# Patient Record
Sex: Female | Born: 1965 | Race: Black or African American | Hispanic: No | Marital: Single | Smoking: Never smoker
Health system: Southern US, Community
[De-identification: ages and names within clinical notes are randomized; demographics above are authoritative.]

## PROBLEM LIST (undated history)

## (undated) DIAGNOSIS — F79 Unspecified intellectual disabilities: Secondary | ICD-10-CM

## (undated) DIAGNOSIS — I2510 Atherosclerotic heart disease of native coronary artery without angina pectoris: Secondary | ICD-10-CM

## (undated) DIAGNOSIS — E079 Disorder of thyroid, unspecified: Secondary | ICD-10-CM

## (undated) DIAGNOSIS — D649 Anemia, unspecified: Secondary | ICD-10-CM

## (undated) DIAGNOSIS — K219 Gastro-esophageal reflux disease without esophagitis: Secondary | ICD-10-CM

## (undated) DIAGNOSIS — I10 Essential (primary) hypertension: Secondary | ICD-10-CM

## (undated) HISTORY — PX: MULTIPLE TOOTH EXTRACTIONS: SHX2053

---

## 2001-11-01 ENCOUNTER — Emergency Department (HOSPITAL_COMMUNITY): Admission: EM | Admit: 2001-11-01 | Discharge: 2001-11-01 | Payer: Self-pay | Admitting: Emergency Medicine

## 2005-07-15 LAB — CONVERTED CEMR LAB: Pap Smear: NORMAL

## 2007-02-03 ENCOUNTER — Ambulatory Visit: Payer: Self-pay | Admitting: Family Medicine

## 2007-02-03 DIAGNOSIS — F79 Unspecified intellectual disabilities: Secondary | ICD-10-CM

## 2007-02-03 DIAGNOSIS — E669 Obesity, unspecified: Secondary | ICD-10-CM

## 2007-02-03 DIAGNOSIS — J309 Allergic rhinitis, unspecified: Secondary | ICD-10-CM | POA: Insufficient documentation

## 2007-02-03 DIAGNOSIS — H519 Unspecified disorder of binocular movement: Secondary | ICD-10-CM | POA: Insufficient documentation

## 2007-02-03 DIAGNOSIS — K219 Gastro-esophageal reflux disease without esophagitis: Secondary | ICD-10-CM

## 2007-02-17 ENCOUNTER — Encounter (INDEPENDENT_AMBULATORY_CARE_PROVIDER_SITE_OTHER): Payer: Self-pay | Admitting: Family Medicine

## 2007-02-18 ENCOUNTER — Telehealth (INDEPENDENT_AMBULATORY_CARE_PROVIDER_SITE_OTHER): Payer: Self-pay | Admitting: *Deleted

## 2007-02-18 LAB — CONVERTED CEMR LAB
ALT: 12 units/L (ref 0–35)
Albumin: 4.3 g/dL (ref 3.5–5.2)
Alkaline Phosphatase: 50 units/L (ref 39–117)
Basophils Relative: 0 % (ref 0–1)
CO2: 19 meq/L (ref 19–32)
Chloride: 104 meq/L (ref 96–112)
Cholesterol: 201 mg/dL — ABNORMAL HIGH (ref 0–200)
Creatinine, Ser: 0.81 mg/dL (ref 0.40–1.20)
Eosinophils Absolute: 0.1 10*3/uL (ref 0.0–0.7)
Glucose, Bld: 88 mg/dL (ref 70–99)
HCT: 35.8 % — ABNORMAL LOW (ref 36.0–46.0)
Hemoglobin: 11.6 g/dL — ABNORMAL LOW (ref 12.0–15.0)
LDL Cholesterol: 133 mg/dL — ABNORMAL HIGH (ref 0–99)
Lymphocytes Relative: 27 % (ref 12–46)
MCHC: 32.4 g/dL (ref 30.0–36.0)
MCV: 94.2 fL (ref 78.0–100.0)
Monocytes Absolute: 0.4 10*3/uL (ref 0.2–0.7)
Neutro Abs: 2.9 10*3/uL (ref 1.7–7.7)
Neutrophils Relative %: 62 % (ref 43–77)
Platelets: 263 10*3/uL (ref 150–400)
Potassium: 4.2 meq/L (ref 3.5–5.3)
RBC: 3.8 M/uL — ABNORMAL LOW (ref 3.87–5.11)
Sodium: 136 meq/L (ref 135–145)
TSH: 4.092 microintl units/mL (ref 0.350–5.50)
Total Bilirubin: 0.4 mg/dL (ref 0.3–1.2)
Total CHOL/HDL Ratio: 4.4
Total Protein: 7.7 g/dL (ref 6.0–8.3)
Triglycerides: 110 mg/dL (ref <150)

## 2007-03-09 ENCOUNTER — Ambulatory Visit: Payer: Self-pay | Admitting: Family Medicine

## 2007-03-09 DIAGNOSIS — E785 Hyperlipidemia, unspecified: Secondary | ICD-10-CM | POA: Insufficient documentation

## 2007-03-09 DIAGNOSIS — D649 Anemia, unspecified: Secondary | ICD-10-CM

## 2007-03-09 LAB — CONVERTED CEMR LAB
Cholesterol, target level: 200 mg/dL
LDL Goal: 160 mg/dL

## 2007-03-11 ENCOUNTER — Encounter (INDEPENDENT_AMBULATORY_CARE_PROVIDER_SITE_OTHER): Payer: Self-pay | Admitting: Family Medicine

## 2007-04-20 ENCOUNTER — Ambulatory Visit: Payer: Self-pay | Admitting: Family Medicine

## 2007-07-20 ENCOUNTER — Ambulatory Visit: Payer: Self-pay | Admitting: Family Medicine

## 2007-08-31 ENCOUNTER — Ambulatory Visit: Payer: Self-pay | Admitting: Family Medicine

## 2007-09-01 ENCOUNTER — Encounter (INDEPENDENT_AMBULATORY_CARE_PROVIDER_SITE_OTHER): Payer: Self-pay | Admitting: Family Medicine

## 2007-09-01 LAB — CONVERTED CEMR LAB
Eosinophils Relative: 2 % (ref 0–5)
HCT: 34.5 % — ABNORMAL LOW (ref 36.0–46.0)
Lymphs Abs: 1.1 10*3/uL (ref 0.7–4.0)
MCHC: 31.6 g/dL (ref 30.0–36.0)
Monocytes Relative: 8 % (ref 3–12)
Neutro Abs: 2.9 10*3/uL (ref 1.7–7.7)
Neutrophils Relative %: 66 % (ref 43–77)
Platelets: 301 10*3/uL (ref 150–400)
WBC: 4.5 10*3/uL (ref 4.0–10.5)

## 2007-09-02 ENCOUNTER — Encounter (INDEPENDENT_AMBULATORY_CARE_PROVIDER_SITE_OTHER): Payer: Self-pay | Admitting: *Deleted

## 2007-09-02 LAB — CONVERTED CEMR LAB
Retic Ct Pct: 2.2 % (ref 0.4–3.1)
Saturation Ratios: 20 % (ref 20–55)
TIBC: 374 ug/dL (ref 250–470)

## 2007-10-13 ENCOUNTER — Ambulatory Visit: Payer: Self-pay | Admitting: Family Medicine

## 2007-11-13 ENCOUNTER — Telehealth (INDEPENDENT_AMBULATORY_CARE_PROVIDER_SITE_OTHER): Payer: Self-pay | Admitting: *Deleted

## 2008-01-20 ENCOUNTER — Ambulatory Visit: Payer: Self-pay | Admitting: Family Medicine

## 2008-01-21 LAB — CONVERTED CEMR LAB
Basophils Absolute: 0 10*3/uL (ref 0.0–0.1)
Eosinophils Absolute: 0.1 10*3/uL (ref 0.0–0.7)
HCT: 35.4 % — ABNORMAL LOW (ref 36.0–46.0)
Hemoglobin: 11.5 g/dL — ABNORMAL LOW (ref 12.0–15.0)
MCHC: 32.5 g/dL (ref 30.0–36.0)
MCV: 88.9 fL (ref 78.0–100.0)
Monocytes Relative: 7 % (ref 3–12)
Neutrophils Relative %: 63 % (ref 43–77)
RDW: 14.9 % (ref 11.5–15.5)
WBC: 4.6 10*3/uL (ref 4.0–10.5)

## 2008-03-02 ENCOUNTER — Encounter (INDEPENDENT_AMBULATORY_CARE_PROVIDER_SITE_OTHER): Payer: Self-pay | Admitting: Family Medicine

## 2008-03-02 ENCOUNTER — Other Ambulatory Visit: Admission: RE | Admit: 2008-03-02 | Discharge: 2008-03-02 | Payer: Self-pay | Admitting: Family Medicine

## 2008-03-02 ENCOUNTER — Ambulatory Visit: Payer: Self-pay | Admitting: Family Medicine

## 2008-04-06 ENCOUNTER — Encounter (INDEPENDENT_AMBULATORY_CARE_PROVIDER_SITE_OTHER): Payer: Self-pay | Admitting: Family Medicine

## 2008-04-13 LAB — CONVERTED CEMR LAB
AST: 86 units/L — ABNORMAL HIGH (ref 0–37)
Albumin: 4.6 g/dL (ref 3.5–5.2)
BUN: 10 mg/dL (ref 6–23)
CO2: 20 meq/L (ref 19–32)
Chloride: 103 meq/L (ref 96–112)
HDL: 50 mg/dL (ref >39)
Sodium: 139 meq/L (ref 135–145)
Total Bilirubin: 0.5 mg/dL (ref 0.3–1.2)
Total CHOL/HDL Ratio: 4.7
Total Protein: 8.3 g/dL (ref 6.0–8.3)
Triglycerides: 101 mg/dL (ref <150)

## 2008-06-08 ENCOUNTER — Ambulatory Visit: Payer: Self-pay | Admitting: Family Medicine

## 2008-06-09 ENCOUNTER — Encounter (INDEPENDENT_AMBULATORY_CARE_PROVIDER_SITE_OTHER): Payer: Self-pay | Admitting: Family Medicine

## 2008-06-09 LAB — CONVERTED CEMR LAB
AST: 14 units/L (ref 0–37)
Albumin: 4.5 g/dL (ref 3.5–5.2)
Alkaline Phosphatase: 51 units/L (ref 39–117)
CO2: 20 meq/L (ref 19–32)
Calcium: 8.9 mg/dL (ref 8.4–10.5)
Chloride: 106 meq/L (ref 96–112)
Glucose, Bld: 100 mg/dL — ABNORMAL HIGH (ref 70–99)
Potassium: 4 meq/L (ref 3.5–5.3)
Sodium: 140 meq/L (ref 135–145)

## 2008-06-24 ENCOUNTER — Telehealth (INDEPENDENT_AMBULATORY_CARE_PROVIDER_SITE_OTHER): Payer: Self-pay | Admitting: *Deleted

## 2008-07-06 ENCOUNTER — Ambulatory Visit: Payer: Self-pay | Admitting: Family Medicine

## 2008-07-21 ENCOUNTER — Encounter (INDEPENDENT_AMBULATORY_CARE_PROVIDER_SITE_OTHER): Payer: Self-pay | Admitting: Family Medicine

## 2008-08-11 ENCOUNTER — Ambulatory Visit: Payer: Self-pay | Admitting: Family Medicine

## 2008-09-22 ENCOUNTER — Ambulatory Visit: Payer: Self-pay | Admitting: Family Medicine

## 2008-09-22 DIAGNOSIS — R030 Elevated blood-pressure reading, without diagnosis of hypertension: Secondary | ICD-10-CM

## 2008-09-23 ENCOUNTER — Encounter (INDEPENDENT_AMBULATORY_CARE_PROVIDER_SITE_OTHER): Payer: Self-pay | Admitting: Family Medicine

## 2008-09-26 LAB — CONVERTED CEMR LAB
ALT: 9 units/L (ref 0–35)
AST: 12 units/L (ref 0–37)
Alkaline Phosphatase: 51 units/L (ref 39–117)
Basophils Absolute: 0 10*3/uL (ref 0.0–0.1)
Basophils Relative: 0 % (ref 0–1)
CO2: 22 meq/L (ref 19–32)
Cholesterol: 203 mg/dL — ABNORMAL HIGH (ref 0–200)
Creatinine, Ser: 0.74 mg/dL (ref 0.40–1.20)
Eosinophils Absolute: 0.1 10*3/uL (ref 0.0–0.7)
LDL Cholesterol: 134 mg/dL — ABNORMAL HIGH (ref 0–99)
MCHC: 32.3 g/dL (ref 30.0–36.0)
MCV: 91.5 fL (ref 78.0–100.0)
Neutro Abs: 3 10*3/uL (ref 1.7–7.7)
Neutrophils Relative %: 60 % (ref 43–77)
Platelets: 276 10*3/uL (ref 150–400)
RBC: 3.89 M/uL (ref 3.87–5.11)
Sodium: 140 meq/L (ref 135–145)
Total Bilirubin: 0.3 mg/dL (ref 0.3–1.2)
Total CHOL/HDL Ratio: 3.7
Total Protein: 7.8 g/dL (ref 6.0–8.3)
VLDL: 14 mg/dL (ref 0–40)

## 2008-12-23 ENCOUNTER — Ambulatory Visit: Payer: Self-pay | Admitting: Family Medicine

## 2009-01-02 ENCOUNTER — Encounter (INDEPENDENT_AMBULATORY_CARE_PROVIDER_SITE_OTHER): Payer: Self-pay | Admitting: Family Medicine

## 2010-05-06 ENCOUNTER — Encounter: Payer: Self-pay | Admitting: Family Medicine

## 2012-12-22 ENCOUNTER — Other Ambulatory Visit (HOSPITAL_COMMUNITY): Payer: Self-pay | Admitting: Family Medicine

## 2012-12-22 DIAGNOSIS — Z139 Encounter for screening, unspecified: Secondary | ICD-10-CM

## 2012-12-24 ENCOUNTER — Ambulatory Visit (HOSPITAL_COMMUNITY)
Admission: RE | Admit: 2012-12-24 | Discharge: 2012-12-24 | Disposition: A | Discharge status: Home / Self Care | Payer: Medicare Other | Source: Ambulatory Visit | Attending: Family Medicine | Admitting: Family Medicine

## 2012-12-24 DIAGNOSIS — Z1231 Encounter for screening mammogram for malignant neoplasm of breast: Secondary | ICD-10-CM | POA: Insufficient documentation

## 2012-12-24 DIAGNOSIS — Z139 Encounter for screening, unspecified: Secondary | ICD-10-CM

## 2013-12-21 ENCOUNTER — Other Ambulatory Visit (HOSPITAL_COMMUNITY): Payer: Self-pay | Admitting: Family Medicine

## 2013-12-21 DIAGNOSIS — Z139 Encounter for screening, unspecified: Secondary | ICD-10-CM

## 2013-12-21 DIAGNOSIS — Z1231 Encounter for screening mammogram for malignant neoplasm of breast: Secondary | ICD-10-CM

## 2013-12-27 ENCOUNTER — Ambulatory Visit (HOSPITAL_COMMUNITY)
Admission: RE | Admit: 2013-12-27 | Discharge: 2013-12-27 | Disposition: A | Discharge status: Home / Self Care | Payer: Medicare Other | Source: Ambulatory Visit | Attending: Family Medicine | Admitting: Family Medicine

## 2013-12-27 DIAGNOSIS — Z1231 Encounter for screening mammogram for malignant neoplasm of breast: Secondary | ICD-10-CM

## 2019-07-16 ENCOUNTER — Ambulatory Visit: Payer: Self-pay | Attending: Internal Medicine

## 2019-07-16 DIAGNOSIS — Z23 Encounter for immunization: Secondary | ICD-10-CM

## 2019-07-16 NOTE — Progress Notes (Signed)
   Covid-19 Vaccination Clinic  Name:  Anna West    MRN: [id number] DOB: 1966/03/26  07/16/2019  Anna West was observed post Covid-19 immunization for 15 minutes without incident. She was provided with Vaccine Information Sheet and instruction to access the V-Safe system.   Anna West was instructed to call 911 with any severe reactions post vaccine: Marland Kitchen Difficulty breathing  . Swelling of face and throat  . A fast heartbeat  . A bad rash all over body  . Dizziness and weakness   Immunizations Administered    Name Date Dose VIS Date Route   Moderna COVID-19 Vaccine 07/16/2019  9:23 AM 0.5 mL 03/16/2019 Intramuscular   Manufacturer: Moderna   Lot: [id number]   [id number]

## 2019-08-17 ENCOUNTER — Ambulatory Visit: Payer: Self-pay | Attending: Internal Medicine

## 2019-08-17 DIAGNOSIS — Z23 Encounter for immunization: Secondary | ICD-10-CM

## 2019-08-17 NOTE — Progress Notes (Signed)
   Covid-19 Vaccination Clinic  Name:  Anna West    MRN: [id number] DOB: [birth year]  08/17/2019  [name] was observed post Covid-19 immunization for 15 minutes without incident. She was provided with Vaccine Information Sheet and instruction to access the V-Safe system.   [name] was instructed to call 911 with any severe reactions post vaccine: Marland Kitchen Difficulty breathing  . Swelling of face and throat  . A fast heartbeat  . A bad rash all over body  . Dizziness and weakness   Immunizations Administered    Name Date Dose VIS Date Route   Moderna COVID-19 Vaccine 08/17/2019  8:25 AM 0.5 mL 03/2019 Intramuscular   Manufacturer: Moderna   Lot: [id number]   [place]:[id number]

## 2020-02-17 ENCOUNTER — Ambulatory Visit (HOSPITAL_COMMUNITY)
Admission: RE | Admit: 2020-02-17 | Discharge: 2020-02-17 | Disposition: A | Discharge status: Home / Self Care | Payer: Medicare Other | Source: Ambulatory Visit | Attending: Family Medicine | Admitting: Family Medicine

## 2020-02-17 ENCOUNTER — Other Ambulatory Visit (HOSPITAL_COMMUNITY): Payer: Self-pay | Admitting: Family Medicine

## 2020-02-17 ENCOUNTER — Other Ambulatory Visit: Payer: Self-pay

## 2020-02-17 DIAGNOSIS — M25562 Pain in left knee: Secondary | ICD-10-CM | POA: Diagnosis present

## 2020-06-06 DIAGNOSIS — E559 Vitamin D deficiency, unspecified: Secondary | ICD-10-CM | POA: Diagnosis not present

## 2020-06-06 DIAGNOSIS — E7849 Other hyperlipidemia: Secondary | ICD-10-CM | POA: Diagnosis not present

## 2020-06-06 DIAGNOSIS — R5383 Other fatigue: Secondary | ICD-10-CM | POA: Diagnosis not present

## 2020-06-12 DIAGNOSIS — E039 Hypothyroidism, unspecified: Secondary | ICD-10-CM | POA: Diagnosis not present

## 2020-06-12 DIAGNOSIS — R5382 Chronic fatigue, unspecified: Secondary | ICD-10-CM | POA: Diagnosis not present

## 2020-06-12 DIAGNOSIS — E7849 Other hyperlipidemia: Secondary | ICD-10-CM | POA: Diagnosis not present

## 2020-08-03 DIAGNOSIS — R5383 Other fatigue: Secondary | ICD-10-CM | POA: Diagnosis not present

## 2020-08-03 DIAGNOSIS — E559 Vitamin D deficiency, unspecified: Secondary | ICD-10-CM | POA: Diagnosis not present

## 2020-08-03 DIAGNOSIS — E7849 Other hyperlipidemia: Secondary | ICD-10-CM | POA: Diagnosis not present

## 2020-09-03 ENCOUNTER — Other Ambulatory Visit: Payer: Self-pay

## 2020-09-03 ENCOUNTER — Encounter (HOSPITAL_COMMUNITY): Payer: Self-pay | Admitting: Emergency Medicine

## 2020-09-03 ENCOUNTER — Emergency Department (HOSPITAL_COMMUNITY)
Admission: EM | Admit: 2020-09-03 | Discharge: 2020-09-03 | Disposition: A | Discharge status: Home / Self Care | Payer: Medicare Other | Attending: Emergency Medicine | Admitting: Emergency Medicine

## 2020-09-03 ENCOUNTER — Emergency Department (HOSPITAL_COMMUNITY): Payer: Medicare Other

## 2020-09-03 DIAGNOSIS — R109 Unspecified abdominal pain: Secondary | ICD-10-CM | POA: Diagnosis not present

## 2020-09-03 DIAGNOSIS — I878 Other specified disorders of veins: Secondary | ICD-10-CM | POA: Diagnosis not present

## 2020-09-03 DIAGNOSIS — I2510 Atherosclerotic heart disease of native coronary artery without angina pectoris: Secondary | ICD-10-CM | POA: Diagnosis not present

## 2020-09-03 DIAGNOSIS — R1013 Epigastric pain: Secondary | ICD-10-CM | POA: Insufficient documentation

## 2020-09-03 DIAGNOSIS — R1011 Right upper quadrant pain: Secondary | ICD-10-CM | POA: Insufficient documentation

## 2020-09-03 DIAGNOSIS — K219 Gastro-esophageal reflux disease without esophagitis: Secondary | ICD-10-CM | POA: Insufficient documentation

## 2020-09-03 DIAGNOSIS — I10 Essential (primary) hypertension: Secondary | ICD-10-CM | POA: Diagnosis not present

## 2020-09-03 DIAGNOSIS — R1010 Upper abdominal pain, unspecified: Secondary | ICD-10-CM

## 2020-09-03 HISTORY — DX: Anemia, unspecified: D64.9

## 2020-09-03 HISTORY — DX: Disorder of thyroid, unspecified: E07.9

## 2020-09-03 HISTORY — DX: Unspecified intellectual disabilities: F79

## 2020-09-03 HISTORY — DX: Gastro-esophageal reflux disease without esophagitis: K21.9

## 2020-09-03 HISTORY — DX: Atherosclerotic heart disease of native coronary artery without angina pectoris: I25.10

## 2020-09-03 HISTORY — DX: Essential (primary) hypertension: I10

## 2020-09-03 LAB — CBC WITH DIFFERENTIAL/PLATELET
Abs Immature Granulocytes: 0.02 10*3/uL (ref 0.00–0.07)
Basophils Absolute: 0 10*3/uL (ref 0.0–0.1)
Basophils Relative: 0 %
Eosinophils Absolute: 0.1 10*3/uL (ref 0.0–0.5)
Eosinophils Relative: 1 %
HCT: 39.2 % (ref 36.0–46.0)
Hemoglobin: 12.9 g/dL (ref 12.0–15.0)
Immature Granulocytes: 0 %
Lymphocytes Relative: 16 %
Lymphs Abs: 1.1 10*3/uL (ref 0.7–4.0)
MCH: 31.1 pg (ref 26.0–34.0)
MCHC: 32.9 g/dL (ref 30.0–36.0)
MCV: 94.5 fL (ref 80.0–100.0)
Monocytes Absolute: 0.5 10*3/uL (ref 0.1–1.0)
Monocytes Relative: 8 %
Neutro Abs: 5 10*3/uL (ref 1.7–7.7)
Neutrophils Relative %: 75 %
Platelets: 270 10*3/uL (ref 150–400)
RBC: 4.15 MIL/uL (ref 3.87–5.11)
RDW: 13.2 % (ref 11.5–15.5)
WBC: 6.7 10*3/uL (ref 4.0–10.5)
nRBC: 0 % (ref 0.0–0.2)

## 2020-09-03 LAB — COMPREHENSIVE METABOLIC PANEL
ALT: 17 U/L (ref 0–44)
AST: 17 U/L (ref 15–41)
Albumin: 4.5 g/dL (ref 3.5–5.0)
Alkaline Phosphatase: 59 U/L (ref 38–126)
Anion gap: 10 (ref 5–15)
BUN: 12 mg/dL (ref 6–20)
CO2: 24 mmol/L (ref 22–32)
Calcium: 9.6 mg/dL (ref 8.9–10.3)
Chloride: 102 mmol/L (ref 98–111)
Creatinine, Ser: 0.78 mg/dL (ref 0.44–1.00)
GFR, Estimated: >60 mL/min (ref >60)
Glucose, Bld: 123 mg/dL — ABNORMAL HIGH (ref 70–99)
Potassium: 3.7 mmol/L (ref 3.5–5.1)
Sodium: 136 mmol/L (ref 135–145)
Total Bilirubin: 0.7 mg/dL (ref 0.3–1.2)
Total Protein: 8.6 g/dL — ABNORMAL HIGH (ref 6.5–8.1)

## 2020-09-03 LAB — LIPASE, BLOOD: Lipase: 20 U/L (ref 11–51)

## 2020-09-03 MED ORDER — MORPHINE SULFATE (PF) 4 MG/ML IV SOLN
4.0000 mg | Freq: Once | INTRAVENOUS | Status: AC
  Administered 2020-09-03: 4 mg via INTRAVENOUS
  Filled 2020-09-03: qty 1

## 2020-09-03 MED ORDER — ONDANSETRON HCL 4 MG/2ML IJ SOLN
4.0000 mg | Freq: Once | INTRAMUSCULAR | Status: AC
  Administered 2020-09-03: 4 mg via INTRAVENOUS
  Filled 2020-09-03: qty 2

## 2020-09-03 MED ORDER — HYDROCODONE-ACETAMINOPHEN 5-325 MG PO TABS
1.0000 | ORAL_TABLET | Freq: Four times a day (QID) | ORAL | 0 refills | Status: DC | PRN
Start: 2020-09-03 — End: 2022-09-03

## 2020-09-03 NOTE — ED Notes (Signed)
Patient transported to CT 

## 2020-09-03 NOTE — Discharge Instructions (Signed)
Take hydrocodone as prescribed as needed for pain.  Outpatient ultrasound of your gallbladder to be scheduled in the near future.  Please follow-up the results with your primary doctor.  Your CAT scan also shows an enlarged uterus.  This finding may require additional imaging studies.  Please follow-up with your primary doctor to make these arrangements.  Return to the emergency department in the meantime if you develop worsening pain, high fever, bloody stool or vomit, or other new and concerning symptoms.

## 2020-09-03 NOTE — ED Provider Notes (Signed)
[facility] EMERGENCY DEPARTMENT Provider Note   CSN: [id number] Arrival date & time: 09/03/20  0354     History Chief Complaint  Patient presents with  . Abdominal Pain    Anna West is a 55 y.o. female.  Patient is a 55 year old female with past medical history of hypertension, coronary artery disease, thyroid disease, and mild MR.  She presents with complaints of epigastric pain radiating into the right upper quadrant and right flank.  This started 2 days ago and is gradually worsening.  She denies any bowel or bladder complaints.  She denies any fevers or chills.  The history is provided by the patient.  Abdominal Pain Pain location:  Epigastric and RUQ Pain quality: stabbing   Pain radiates to:  Does not radiate Pain severity:  Moderate Onset quality:  Gradual Timing:  Constant Progression:  Worsening Relieved by:  Nothing Worsened by:  Movement and palpation      Past Medical History:  Diagnosis Date  . Anemia   . Coronary artery disease   . GERD (gastroesophageal reflux disease)   . Hypertension   . Mentally challenged   . Thyroid disease     Patient Active Problem List   Diagnosis Date Noted  . ELEVATED BLOOD PRESSURE WITHOUT DIAGNOSIS OF HYPERTENSION 09/22/2008  . HYPERLIPIDEMIA 03/09/2007  . ANEMIA, NORMOCYTIC 03/09/2007  . OBESITY 02/03/2007  . MENTAL RETARDATION 02/03/2007  . STRABISMUS 02/03/2007  . ALLERGIC RHINITIS 02/03/2007  . GERD 02/03/2007    History reviewed. No pertinent surgical history.   OB History   No obstetric history on file.     History reviewed. No pertinent family history.  Social History   Tobacco Use  . Smoking status: Never Smoker  . Smokeless tobacco: Never Used  Substance Use Topics  . Alcohol use: Never  . Drug use: Never    Home Medications Prior to Admission medications   Not on File    Allergies    Patient has no known allergies.  Review of Systems   Review of Systems  Gastrointestinal:  Positive for abdominal pain.  All other systems reviewed and are negative.   Physical Exam Updated Vital Signs Pulse (!) 132   Temp 98.5 F (36.9 C) (Oral)   Resp (!) 22   Ht 4\' 10"  (1.473 m)   Wt 94 kg   SpO2 100%   BMI 43.31 kg/m   Physical Exam Vitals and nursing note reviewed.  Constitutional:      General: She is not in acute distress.    Appearance: She is well-developed. She is not diaphoretic.  HENT:     Head: Normocephalic and atraumatic.  Cardiovascular:     Rate and Rhythm: Normal rate and regular rhythm.     Heart sounds: No murmur heard. No friction rub. No gallop.   Pulmonary:     Effort: Pulmonary effort is normal. No respiratory distress.     Breath sounds: Normal breath sounds. No wheezing.  Abdominal:     General: Bowel sounds are normal. There is no distension.     Palpations: Abdomen is soft.     Tenderness: There is abdominal tenderness in the right upper quadrant and epigastric area. There is no right CVA tenderness, left CVA tenderness, guarding or rebound.  Musculoskeletal:        General: Normal range of motion.     Cervical back: Normal range of motion and neck supple.  Skin:    General: Skin is warm and dry.  Neurological:  Mental Status: She is alert and oriented to person, place, and time.     ED Results / Procedures / Treatments   Labs (all labs ordered are listed, but only abnormal results are displayed) Labs Reviewed - No data to display  EKG None  Radiology No results found.  Procedures Procedures   Medications Ordered in ED Medications  ondansetron (ZOFRAN) injection 4 mg (has no administration in time range)  morphine 4 MG/ML injection 4 mg (has no administration in time range)    ED Course  I have reviewed the triage vital signs and the nursing notes.  Pertinent labs & imaging results that were available during my care of the patient were reviewed by me and considered in my medical decision making (see chart for  details).    MDM Rules/Calculators/A&P  Patient presenting with a 2-day history of right upper quadrant pain/epigastric pain.  She is tender on these areas on initial exam and pain has resolved after receiving morphine here in the ER.  Patient is afebrile with no white count and normal liver function test.  Ultrasound was obtained showing indistinct appearance of the gallbladder and adjacent duodenal bulb.  It is unclear whether this is motion artifact and ultrasound is recommended.  With today being Sunday, ultrasound does not arrive until 9 AM.  The options of staying here until 9 to have the ultrasound obtained versus having this performed as an outpatient were offered.  Patient wants to go home as she is now feeling better and has no abdominal tenderness.  This seems like a reasonable plan.  She will be discharged with pain medication and outpatient ultrasound.  To return as needed if symptoms worsen or change in the meantime.  Final Clinical Impression(s) / ED Diagnoses Final diagnoses:  None    Rx / DC Orders ED Discharge Orders    None       Veryl Speak, MD 09/03/20 [phone]

## 2020-09-03 NOTE — ED Triage Notes (Addendum)
Pt with c/o R sided abdominal pain since last night. Last BM yesterday.

## 2020-09-03 NOTE — ED Notes (Signed)
ED Provider at bedside. 

## 2020-09-03 NOTE — ED Notes (Signed)
Pt ambulated to restroom without assistance.

## 2020-09-04 DIAGNOSIS — R5382 Chronic fatigue, unspecified: Secondary | ICD-10-CM | POA: Diagnosis not present

## 2020-09-04 DIAGNOSIS — R1031 Right lower quadrant pain: Secondary | ICD-10-CM | POA: Diagnosis not present

## 2020-09-13 ENCOUNTER — Ambulatory Visit (HOSPITAL_COMMUNITY)
Admission: RE | Admit: 2020-09-13 | Discharge: 2020-09-13 | Disposition: A | Discharge status: Home / Self Care | Payer: Medicare Other | Source: Ambulatory Visit | Attending: Emergency Medicine | Admitting: Emergency Medicine

## 2020-09-13 DIAGNOSIS — R1010 Upper abdominal pain, unspecified: Secondary | ICD-10-CM | POA: Insufficient documentation

## 2020-09-13 DIAGNOSIS — R109 Unspecified abdominal pain: Secondary | ICD-10-CM | POA: Diagnosis not present

## 2020-09-15 DIAGNOSIS — E559 Vitamin D deficiency, unspecified: Secondary | ICD-10-CM | POA: Diagnosis not present

## 2020-09-15 DIAGNOSIS — J309 Allergic rhinitis, unspecified: Secondary | ICD-10-CM | POA: Diagnosis not present

## 2020-09-15 DIAGNOSIS — E039 Hypothyroidism, unspecified: Secondary | ICD-10-CM | POA: Diagnosis not present

## 2020-09-19 ENCOUNTER — Ambulatory Visit (INDEPENDENT_AMBULATORY_CARE_PROVIDER_SITE_OTHER): Payer: Medicare Other | Admitting: Adult Health

## 2020-09-19 ENCOUNTER — Other Ambulatory Visit (HOSPITAL_COMMUNITY)
Admission: RE | Admit: 2020-09-19 | Discharge: 2020-09-19 | Disposition: A | Discharge status: Home / Self Care | Payer: Medicare Other | Source: Ambulatory Visit | Attending: Adult Health | Admitting: Adult Health

## 2020-09-19 ENCOUNTER — Encounter: Payer: Self-pay | Admitting: Adult Health

## 2020-09-19 ENCOUNTER — Other Ambulatory Visit: Payer: Self-pay

## 2020-09-19 VITALS — BP 129/89 | HR 104 | Ht <= 58 in | Wt 210.0 lb

## 2020-09-19 DIAGNOSIS — Z1151 Encounter for screening for human papillomavirus (HPV): Secondary | ICD-10-CM | POA: Diagnosis not present

## 2020-09-19 DIAGNOSIS — Z124 Encounter for screening for malignant neoplasm of cervix: Secondary | ICD-10-CM | POA: Insufficient documentation

## 2020-09-19 DIAGNOSIS — Z780 Asymptomatic menopausal state: Secondary | ICD-10-CM

## 2020-09-19 DIAGNOSIS — Z01419 Encounter for gynecological examination (general) (routine) without abnormal findings: Secondary | ICD-10-CM | POA: Insufficient documentation

## 2020-09-19 DIAGNOSIS — D219 Benign neoplasm of connective and other soft tissue, unspecified: Secondary | ICD-10-CM

## 2020-09-19 DIAGNOSIS — N852 Hypertrophy of uterus: Secondary | ICD-10-CM

## 2020-09-19 NOTE — Progress Notes (Signed)
  Subjective:     Patient ID: Anna West, female   DOB: [birth year], 55 y.o.   MRN: [id number]  HPI Anna West is a 55 year old black female,single, G0P0, PM referred by Dr Anna West for uterine enlargement and ?endoemtrial  mass on CT. She declines any pain today and no bleeding since period stopped 2 years ago. PCP is Dr Anna West   Review of Systems Denies any pelvic pain or vaginal bleeding,nausea,vomiting or diarrhea Has never had sex Reviewed past medical,surgical, social and family history. Reviewed medications and allergies.     Objective:   Physical Exam BP 129/89 (BP Location: Left Arm, Patient Position: Sitting, Cuff Size: Normal)   Pulse (!) 104   Ht 4\' 10"  (1.473 m)   Wt 210 lb (95.3 kg)   BMI 43.89 kg/m  Skin warm and dry.Pelvic: external genitalia is normal in appearance no lesions, vagina: pale, used small Pederson speculum,urethra has no lesions or masses noted, cervix:smooth, pap with HR HPV genotyping performed, uterus: enlarged, about 14 week size non tender,+fibroids, adnexa: no masses or tenderness noted. Bladder is non tender and no masses felt AA is 0 Fall risk is low PHQ 9 score is 7 Gad 7 score is 0  [facility] - 09/19/20 1026      Pregnancy Intention Screening   Does the patient want to become pregnant in the next year? No    Does the patient's partner want to become pregnant in the next year? No    Would the patient like to discuss contraceptive options today? No      Contraception Wrap Up   Current Method Abstinence    End Method Abstinence    Contraception Counseling Provided No            Examination chaperoned by Celene Squibb LPN Assessment:     1. Enlarged uterus Will get pelvic US 6/15 at 9:30 am at [facility] to assess uterus further   2. Fibroids  3. Visit for pelvic exam  4. Routine cervical smear Pap sent   5. Postmenopause     Plan:     Follow up with me 09/29/20 for review of [place] and pap

## 2020-09-22 LAB — CYTOLOGY - PAP
Adequacy: ABSENT
Comment: NEGATIVE
Diagnosis: NEGATIVE
High risk HPV: NEGATIVE

## 2020-09-27 ENCOUNTER — Ambulatory Visit (HOSPITAL_COMMUNITY)
Admission: RE | Admit: 2020-09-27 | Discharge: 2020-09-27 | Disposition: A | Discharge status: Home / Self Care | Payer: Medicare Other | Source: Ambulatory Visit | Attending: Adult Health | Admitting: Adult Health

## 2020-09-27 ENCOUNTER — Other Ambulatory Visit: Payer: Self-pay | Admitting: Adult Health

## 2020-09-27 DIAGNOSIS — D219 Benign neoplasm of connective and other soft tissue, unspecified: Secondary | ICD-10-CM | POA: Diagnosis not present

## 2020-09-27 DIAGNOSIS — N852 Hypertrophy of uterus: Secondary | ICD-10-CM | POA: Insufficient documentation

## 2020-09-29 ENCOUNTER — Other Ambulatory Visit: Payer: Self-pay

## 2020-09-29 ENCOUNTER — Encounter: Payer: Self-pay | Admitting: Adult Health

## 2020-09-29 ENCOUNTER — Ambulatory Visit (INDEPENDENT_AMBULATORY_CARE_PROVIDER_SITE_OTHER): Payer: Medicare Other | Admitting: Adult Health

## 2020-09-29 VITALS — BP 123/81 | HR 93 | Ht <= 58 in | Wt 207.5 lb

## 2020-09-29 DIAGNOSIS — R140 Abdominal distension (gaseous): Secondary | ICD-10-CM | POA: Insufficient documentation

## 2020-09-29 DIAGNOSIS — D219 Benign neoplasm of connective and other soft tissue, unspecified: Secondary | ICD-10-CM | POA: Diagnosis not present

## 2020-09-29 NOTE — Patient Instructions (Signed)
Uterine Fibroids ?Uterine fibroids, also called leiomyomas, are noncancerous (benign) tumors that can grow in the uterus. They can cause heavy menstrual bleeding and pain. Fibroids may also grow in the fallopian tubes, cervix, or tissues (ligaments) near the uterus. ?You may have one or many fibroids. Fibroids vary in size, weight, and where they grow in the uterus. Some can become quite large. Most fibroids do not require medical treatment. ?What are the causes? ?The cause of this condition is not known. ?What increases the risk? ?You are more likely to develop this condition if you: ?Are in your 30s or 40s and have not gone through menopause. ?Have a family history of this condition. ?Are of African American descent. ?Started your menstrual period at age 10 or younger. ?Have never given birth. ?Are overweight or obese. ?What are the signs or symptoms? ?Many women do not have any symptoms. Symptoms of this condition may include: ?Heavy menstrual bleeding. ?Bleeding between menstrual periods. ?Pain and pressure in the pelvic area, between your hip bones. ?Pain during sex. ?Bladder problems, such as needing to urinate right away or more often than usual. ?Inability to have children (infertility). ?Failure to carry pregnancy to term (miscarriage). ?How is this diagnosed? ?This condition may be diagnosed based on: ?Your symptoms and medical history. ?A physical exam. ?A pelvic exam that includes feeling for any tumors. ?Imaging tests, such as ultrasound or MRI. ?How is this treated? ?Treatment for this condition may include follow-up visits with your health care provider to monitor your fibroids for any changes. Other treatment may include: ?Medicines, such as: ?Medicines to relieve pain, including aspirin and NSAIDs, such as ibuprofen or naproxen. ?Hormone therapy. Treatment may be given as a pill or an injection, or it may be inserted into the uterus using an intrauterine device (IUD). ?Surgery that would do one of  the following: ?Remove the fibroids (myomectomy). This may be recommended if fibroids affect your fertility and you want to become pregnant. ?Remove the uterus (hysterectomy). ?Block the blood supply to the fibroids (uterine artery embolization). This can cause them to shrink and die. ?Follow these instructions at home: ?Medicines ?Take over-the-counter and prescription medicines only as told by your health care provider. ?Ask your health care provider if you should take iron pills or eat more iron-rich foods, such as dark green, leafy vegetables. Heavy menstrual bleeding can cause low iron levels. ?Managing pain ?If directed, apply heat to your back or abdomen to reduce pain. Use the heat source that your health care provider recommends, such as a moist heat pack or a heating pad. To apply heat: ?Place a towel between your skin and the heat source. ?Leave the heat on for 20-30 minutes. ?Remove the heat if your skin turns bright red. This is especially important if you are unable to feel pain, heat, or cold. You may have a greater risk of getting burned. ? ?General instructions ?Pay close attention to your menstrual cycle. Tell your health care provider about any changes, such as: ?Heavier bleeding that requires you to change your pads or tampons more than usual. ?A change in the number of days that your menstrual period lasts. ?A change in symptoms that come with your menstrual period, such as back pain or cramps in your abdomen. ?Keep all follow-up visits. This is important, especially if your fibroids need to be monitored for any changes. ?Contact a health care provider if you: ?Have pelvic pain, back pain, or cramps in your abdomen that do not get better with medicine   or heat. ?Develop new bleeding between menstrual periods. ?Have increased bleeding during or between menstrual periods. ?Feel more tired or weak than usual. ?Feel light-headed. ?Get help right away if you: ?Faint. ?Have pelvic pain that suddenly  gets worse. ?Have severe vaginal bleeding that soaks a tampon or pad in 30 minutes or less. ?Summary ?Uterine fibroids are noncancerous (benign) tumors that can develop in the uterus. ?The exact cause of this condition is not known. ?Most fibroids do not require medical treatment unless they affect your ability to have children (fertility). ?Contact a health care provider if you have pelvic pain, back pain, or cramps in your abdomen that do not get better with medicines. ?Get help right away if you faint, have pelvic pain that suddenly gets worse, or have severe vaginal bleeding. ?This information is not intended to replace advice given to you by your health care provider. Make sure you discuss any questions you have with your health care provider. ?Document Revised: 11/02/2019 Document Reviewed: 11/02/2019 ?Elsevier Patient Education ? 2022 Elsevier Inc. ? ?

## 2020-09-29 NOTE — Progress Notes (Signed)
  Subjective:     Patient ID: Anna West, female   DOB: 31-Jul-1965, 55 y.o.   MRN: [id number]  HPI Anna West is a 55 year old black female,single, PM in review pap and [place]. Feels bloated after eating. Lab Results  Component Value Date   DIAGPAP  09/19/2020    - Negative for intraepithelial lesion or malignancy (NILM)   [place] Negative 09/19/2020    PCP is [facility].  Review of Systems +bloating after eating No vaginal bleeding Reviewed past medical,surgical, social and family history. Reviewed medications and allergies.     Objective:   Physical Exam BP 123/81 (BP Location: Left Arm, Patient Position: Sitting, Cuff Size: Large)   Pulse 93   Ht 4\' 10"  (1.473 m)   Wt 207 lb 8 oz (94.1 kg)   BMI 43.37 kg/m  Skin warm and dry. Lungs: clear to ausculation bilaterally. Cardiovascular: regular rate and rhythm.    Fall risk is low  [facility] - 09/29/20 8003       Pregnancy Intention Screening   Does the patient want to become pregnant in the next year? No    Does the patient's partner want to become pregnant in the next year? No    Would the patient like to discuss contraceptive options today? No      Contraception Wrap Up   Current Method Abstinence    End Method Abstinence    Contraception Counseling Provided No            Pap was normal with negative HR HPV and US showed fibroids, uterus is 889 ml,EEC 3 mm,ovaries not seen due to bowel.  Assessment:     1. Fibroids Will follow, no need for surgery Review handout on fibroids  2. Postprandial bloating May need to see GI, talk with PCP    Plan:    Pap in 3 years Follow up prn

## 2020-10-23 DIAGNOSIS — R5383 Other fatigue: Secondary | ICD-10-CM | POA: Diagnosis not present

## 2020-10-23 DIAGNOSIS — E1165 Type 2 diabetes mellitus with hyperglycemia: Secondary | ICD-10-CM | POA: Diagnosis not present

## 2020-10-23 DIAGNOSIS — E7849 Other hyperlipidemia: Secondary | ICD-10-CM | POA: Diagnosis not present

## 2021-02-08 DIAGNOSIS — Z23 Encounter for immunization: Secondary | ICD-10-CM | POA: Diagnosis not present

## 2021-02-08 DIAGNOSIS — E785 Hyperlipidemia, unspecified: Secondary | ICD-10-CM | POA: Diagnosis not present

## 2021-02-08 DIAGNOSIS — R42 Dizziness and giddiness: Secondary | ICD-10-CM | POA: Diagnosis not present

## 2021-02-08 DIAGNOSIS — E039 Hypothyroidism, unspecified: Secondary | ICD-10-CM | POA: Diagnosis not present

## 2021-02-08 DIAGNOSIS — E559 Vitamin D deficiency, unspecified: Secondary | ICD-10-CM | POA: Diagnosis not present

## 2021-02-21 DIAGNOSIS — Z79899 Other long term (current) drug therapy: Secondary | ICD-10-CM | POA: Diagnosis not present

## 2021-02-21 DIAGNOSIS — E559 Vitamin D deficiency, unspecified: Secondary | ICD-10-CM | POA: Diagnosis not present

## 2021-02-21 DIAGNOSIS — E039 Hypothyroidism, unspecified: Secondary | ICD-10-CM | POA: Diagnosis not present

## 2021-03-05 DIAGNOSIS — R1030 Lower abdominal pain, unspecified: Secondary | ICD-10-CM | POA: Diagnosis not present

## 2021-03-05 DIAGNOSIS — R634 Abnormal weight loss: Secondary | ICD-10-CM | POA: Diagnosis not present

## 2021-03-05 DIAGNOSIS — Z79899 Other long term (current) drug therapy: Secondary | ICD-10-CM | POA: Diagnosis not present

## 2021-03-05 DIAGNOSIS — E559 Vitamin D deficiency, unspecified: Secondary | ICD-10-CM | POA: Diagnosis not present

## 2021-03-05 DIAGNOSIS — E039 Hypothyroidism, unspecified: Secondary | ICD-10-CM | POA: Diagnosis not present

## 2021-03-22 ENCOUNTER — Other Ambulatory Visit: Payer: Self-pay | Admitting: Internal Medicine

## 2021-03-22 ENCOUNTER — Other Ambulatory Visit (HOSPITAL_COMMUNITY): Payer: Self-pay | Admitting: Internal Medicine

## 2021-03-22 DIAGNOSIS — R634 Abnormal weight loss: Secondary | ICD-10-CM

## 2021-03-22 DIAGNOSIS — R1030 Lower abdominal pain, unspecified: Secondary | ICD-10-CM

## 2021-03-28 ENCOUNTER — Ambulatory Visit (HOSPITAL_COMMUNITY)
Admission: RE | Admit: 2021-03-28 | Discharge: 2021-03-28 | Disposition: A | Discharge status: Home / Self Care | Payer: Medicare Other | Source: Ambulatory Visit | Attending: Internal Medicine | Admitting: Internal Medicine

## 2021-03-28 ENCOUNTER — Other Ambulatory Visit: Payer: Self-pay

## 2021-03-28 DIAGNOSIS — R1030 Lower abdominal pain, unspecified: Secondary | ICD-10-CM | POA: Insufficient documentation

## 2021-03-28 DIAGNOSIS — R634 Abnormal weight loss: Secondary | ICD-10-CM | POA: Diagnosis not present

## 2021-05-29 DIAGNOSIS — Z79899 Other long term (current) drug therapy: Secondary | ICD-10-CM | POA: Diagnosis not present

## 2021-05-29 DIAGNOSIS — Z0000 Encounter for general adult medical examination without abnormal findings: Secondary | ICD-10-CM | POA: Diagnosis not present

## 2021-06-05 DIAGNOSIS — K219 Gastro-esophageal reflux disease without esophagitis: Secondary | ICD-10-CM | POA: Diagnosis not present

## 2021-06-05 DIAGNOSIS — E039 Hypothyroidism, unspecified: Secondary | ICD-10-CM | POA: Diagnosis not present

## 2021-06-05 DIAGNOSIS — E785 Hyperlipidemia, unspecified: Secondary | ICD-10-CM | POA: Diagnosis not present

## 2021-08-15 DIAGNOSIS — Z135 Encounter for screening for eye and ear disorders: Secondary | ICD-10-CM | POA: Diagnosis not present

## 2021-08-15 DIAGNOSIS — Z0001 Encounter for general adult medical examination with abnormal findings: Secondary | ICD-10-CM | POA: Diagnosis not present

## 2021-08-15 DIAGNOSIS — Z1239 Encounter for other screening for malignant neoplasm of breast: Secondary | ICD-10-CM | POA: Diagnosis not present

## 2021-08-15 DIAGNOSIS — E785 Hyperlipidemia, unspecified: Secondary | ICD-10-CM | POA: Diagnosis not present

## 2021-08-15 DIAGNOSIS — E039 Hypothyroidism, unspecified: Secondary | ICD-10-CM | POA: Diagnosis not present

## 2021-08-28 ENCOUNTER — Other Ambulatory Visit (HOSPITAL_COMMUNITY): Payer: Self-pay | Admitting: Nurse Practitioner

## 2021-08-28 DIAGNOSIS — Z1231 Encounter for screening mammogram for malignant neoplasm of breast: Secondary | ICD-10-CM

## 2021-09-07 DIAGNOSIS — E039 Hypothyroidism, unspecified: Secondary | ICD-10-CM | POA: Diagnosis not present

## 2021-09-07 DIAGNOSIS — E559 Vitamin D deficiency, unspecified: Secondary | ICD-10-CM | POA: Diagnosis not present

## 2021-09-07 DIAGNOSIS — Z130 Encounter for screening for diseases of the blood and blood-forming organs and certain disorders involving the immune mechanism: Secondary | ICD-10-CM | POA: Diagnosis not present

## 2021-09-07 DIAGNOSIS — Z79899 Other long term (current) drug therapy: Secondary | ICD-10-CM | POA: Diagnosis not present

## 2021-09-07 DIAGNOSIS — E785 Hyperlipidemia, unspecified: Secondary | ICD-10-CM | POA: Diagnosis not present

## 2022-05-19 IMAGING — CT CT ABD-PELV W/O CM
2 of 4 series · 16 of 46 positions shown, 18 images · non-contrast
Comparison: None.

CLINICAL DATA: 54-year-old female with right side abdominal pain
since last night.

EXAM:
CT ABDOMEN AND PELVIS WITHOUT CONTRAST
TECHNIQUE: Multidetector CT imaging of the abdomen and pelvis was performed
following the standard protocol without IV contrast.

[Series 2: axial st · axial · 0.86mm/px · z∈[+786,+1226]mm · 13 of 100 slices shown, 15 images]
[im 6/100  soft-tissue]
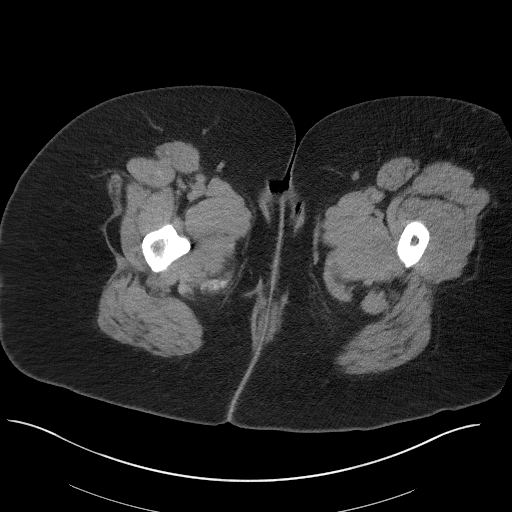
[im 6/100  bone]
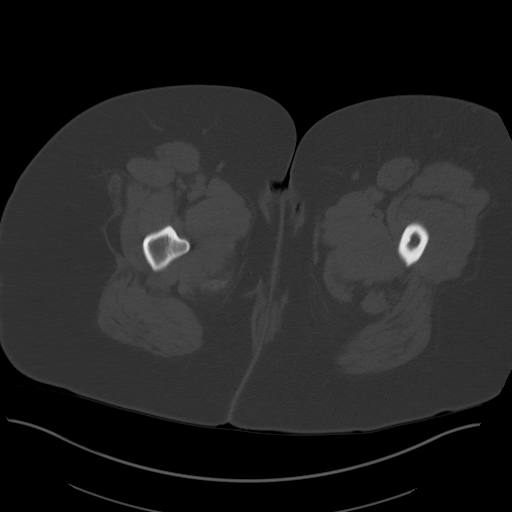
[im 12/100  soft-tissue]
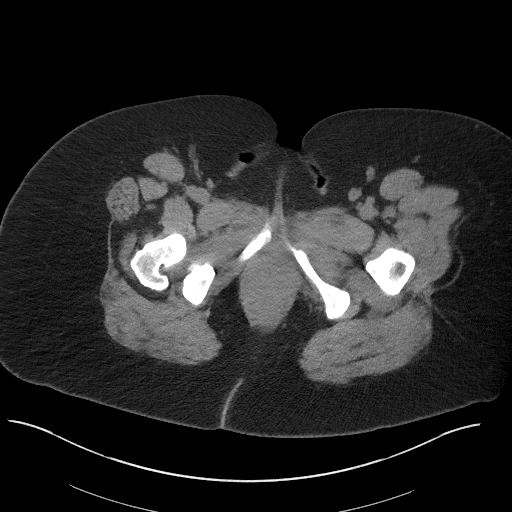
[im 23/100  soft-tissue]
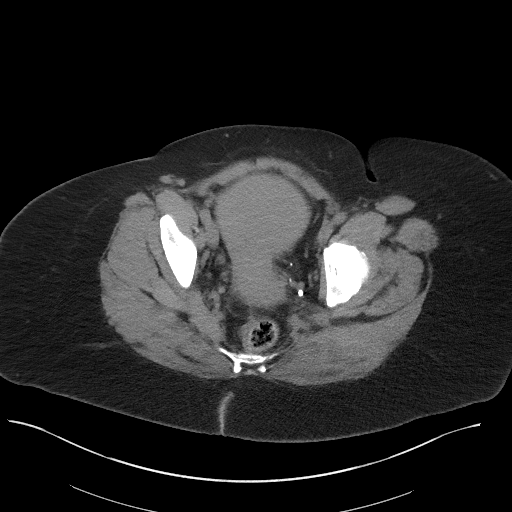
[im 28/100  soft-tissue]
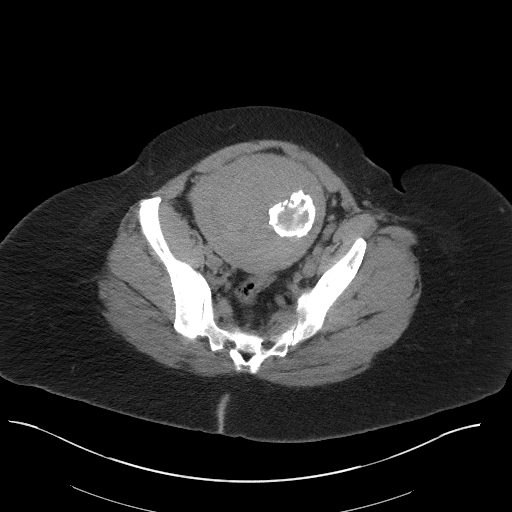
[im 34/100  soft-tissue]
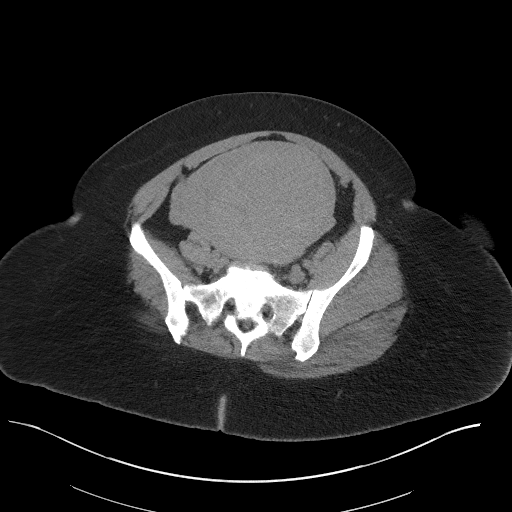
[im 45/100  soft-tissue]
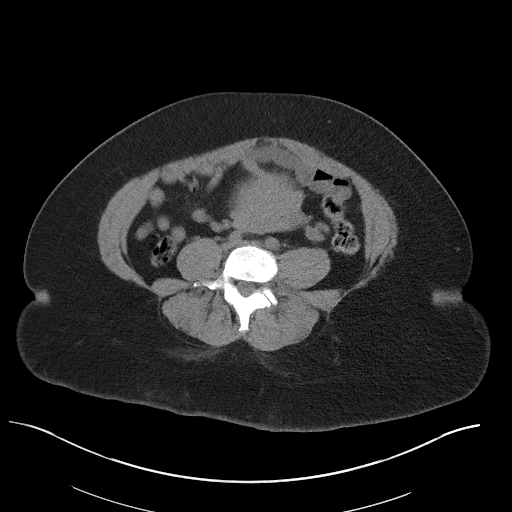
[im 50/100  soft-tissue]
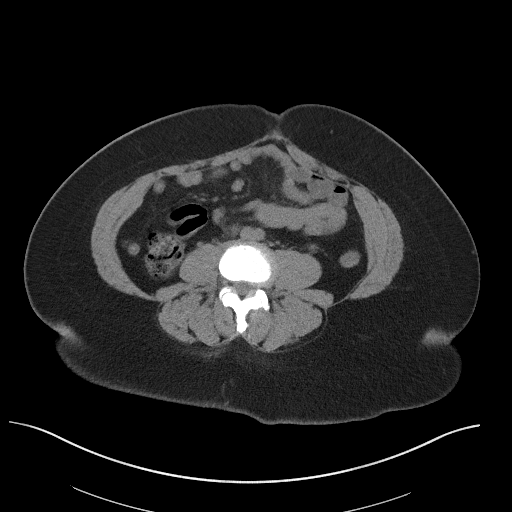
[im 56/100  soft-tissue]
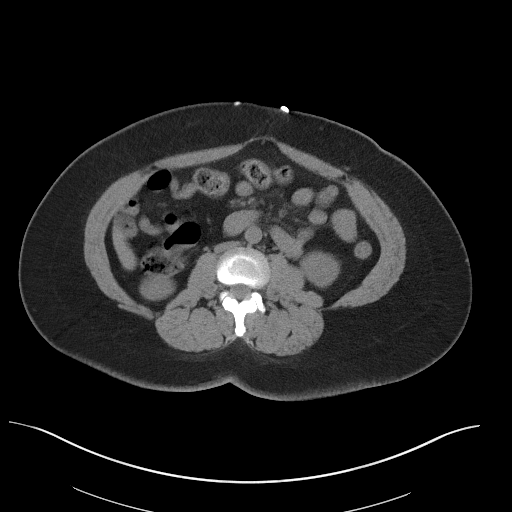
[im 67/100  soft-tissue]
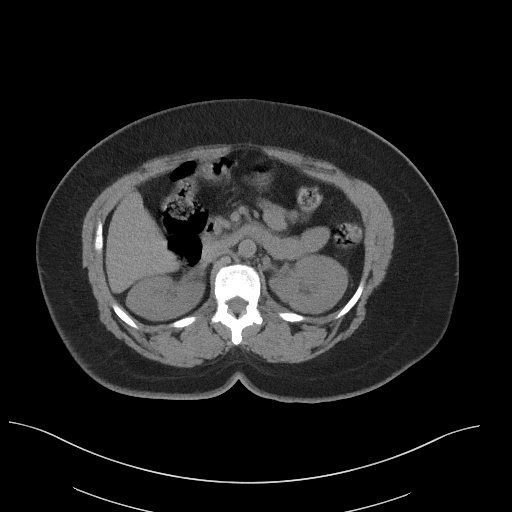
[im 67/100  bone]
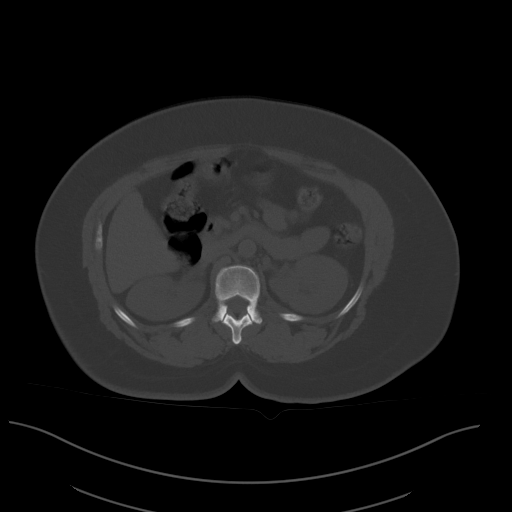
[im 72/100  soft-tissue]
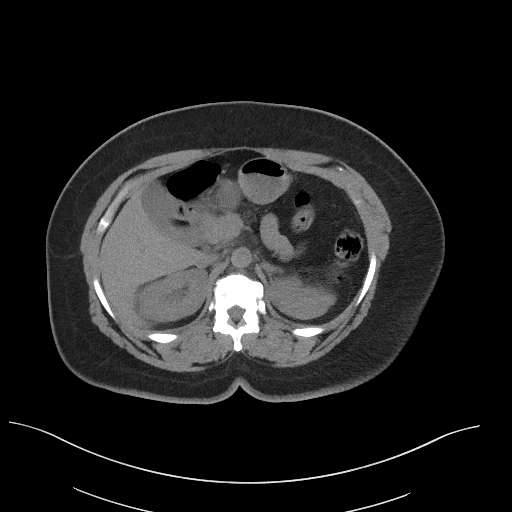
[im 78/100  soft-tissue]
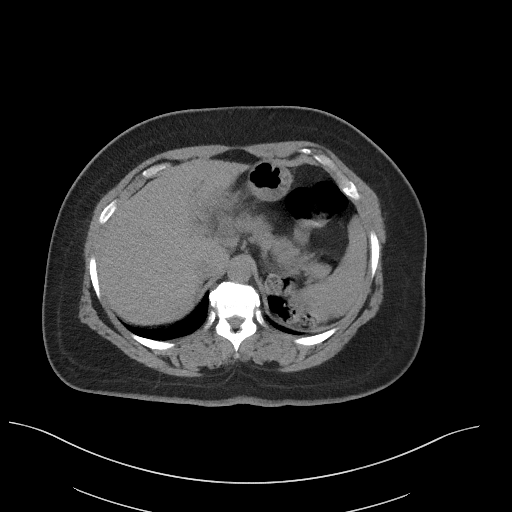
[im 89/100  soft-tissue]
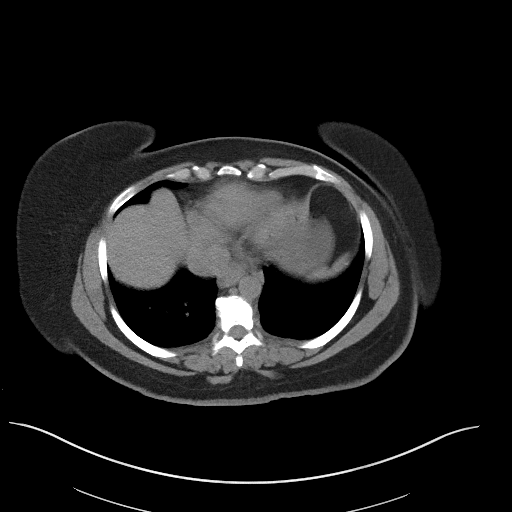
[im 94/100  soft-tissue]
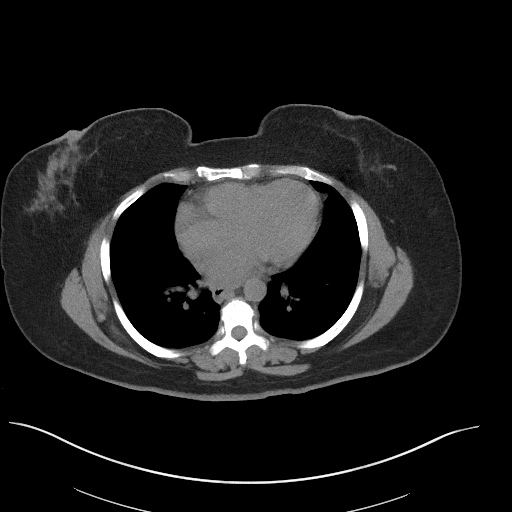

[Series 5: coronal st · coronal · 0.92mm/px · 3 of 114 slices shown]
[im 38/114  soft-tissue]
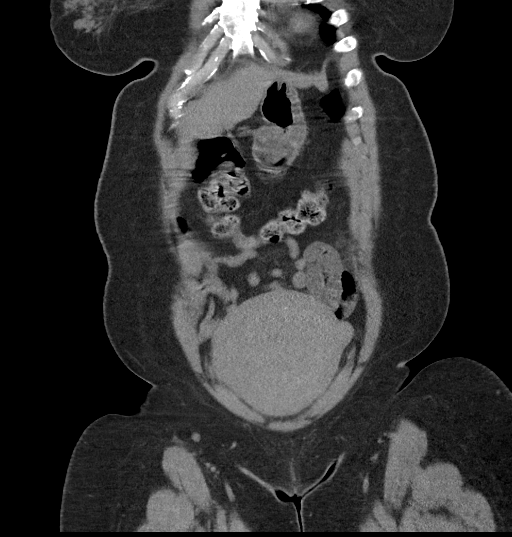
[im 51/114  soft-tissue]
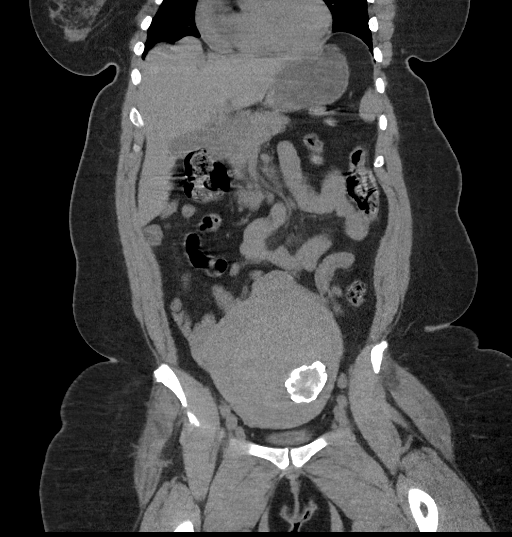
[im 63/114  soft-tissue]
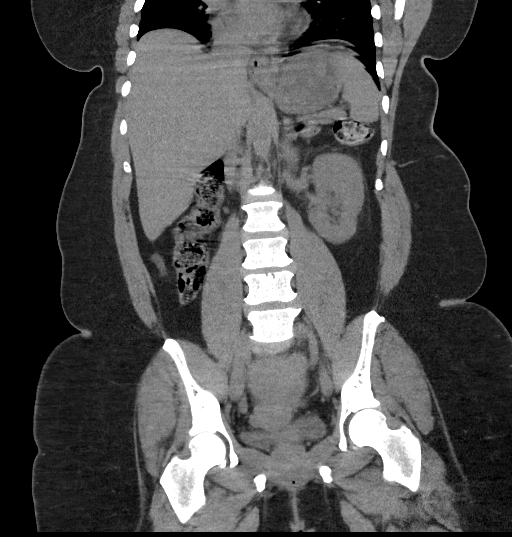

[16 of 46 positions shown; findings below may reference images not displayed]

FINDINGS: Lower chest: Borderline to mild cardiomegaly. No pericardial
effusion. Negative lung bases aside from mild atelectasis. No
pleural effusion.

Hepatobiliary: Mild motion artifact in the upper abdomen, although
unclear whether this explains an indistinct appearance of the
gallbladder and adjacent duodenal bulb on series 2, image 28.
Noncontrast liver appears negative.

Pancreas: Negative.

Spleen: Negative.

Adrenals/Urinary Tract: Normal adrenal glands.

Noncontrast kidneys are within normal limits. No nephrolithiasis. No
hydroureter despite uterine enlargement (see below). No convincing
hydronephrosis. Diminutive and negative urinary bladder. Pelvic
phleboliths mostly on the left side.

Stomach/Bowel: Negative large bowel aside from redundant transverse
colon and mass effect on the sigmoid colon through the pelvic inlet
from enlarged uterus (see below). Diminutive or absent appendix. No
pericecal inflammation. No dilated small bowel. Proximal stomach,
and distal duodenum appear unremarkable. There is an indistinct
appearance of the duodenal bulb in association with the gallbladder.
No free air or free fluid.

Vascular/Lymphatic: Normal caliber abdominal aorta. No calcified
atherosclerosis identified. No lymphadenopathy.

Reproductive: Markedly enlarged body of the uterus, up to 14 cm
diameter with uterine fundus approaching the level of the umbilicus
(sagittal image 83). There is a 4.6 cm dystrophic calcification
within the left mid aspect of the enlarged uterus, but a much larger
underlying rounded intrauterine mass is suspected, approximately
10-11 cm diameter on this noncontrast exam. Lower uterine segment
has a more normal appearance. Unremarkable noncontrast adnexa.

Other: No pelvic free fluid.

Musculoskeletal: Chronic disc and endplate degeneration in the
thoracic and mid lumbar spine. No acute osseous abnormality
identified.
IMPRESSION: 1. Dominant finding is markedly enlarged uterus (14 cm). And while
this might be entirely related to fibroid(s), an endometrial mass is
difficult to exclude on this noncontrast exam. Recommend further
characterization - ideally with Pelvis MRI (GYN protocol without and
with contrast), but Pelvis Ultrasound [DATE]. Indistinct appearance of the gallbladder and adjacent duodenal
bulb, unclear whether this is completely explained by mild motion
artifact in the upper abdomen. If there is upper abdominal pain
recommend follow-up Right Upper Quadrant Ultrasound.

3. No other acute or inflammatory process in the non-contrast
abdomen or pelvis. There is borderline to mild cardiomegaly.

## 2022-08-26 ENCOUNTER — Other Ambulatory Visit (HOSPITAL_COMMUNITY): Payer: Self-pay | Admitting: Adult Health

## 2022-08-26 ENCOUNTER — Telehealth: Payer: Self-pay

## 2022-08-26 DIAGNOSIS — Z1231 Encounter for screening mammogram for malignant neoplasm of breast: Secondary | ICD-10-CM

## 2022-08-26 NOTE — Telephone Encounter (Signed)
I tired calling patient in regards to a referral that was received. No voice mail set up nor a my chart to send a message. ep

## 2022-09-03 ENCOUNTER — Other Ambulatory Visit (HOSPITAL_COMMUNITY)
Admission: RE | Admit: 2022-09-03 | Discharge: 2022-09-03 | Disposition: A | Discharge status: Home / Self Care | Payer: Medicare Other | Source: Ambulatory Visit | Attending: Adult Health | Admitting: Adult Health

## 2022-09-03 ENCOUNTER — Ambulatory Visit (INDEPENDENT_AMBULATORY_CARE_PROVIDER_SITE_OTHER): Payer: Medicare Other | Admitting: Adult Health

## 2022-09-03 ENCOUNTER — Encounter: Payer: Self-pay | Admitting: Adult Health

## 2022-09-03 VITALS — BP 122/77 | HR 106 | Ht <= 58 in | Wt 230.0 lb

## 2022-09-03 DIAGNOSIS — Z780 Asymptomatic menopausal state: Secondary | ICD-10-CM

## 2022-09-03 DIAGNOSIS — N852 Hypertrophy of uterus: Secondary | ICD-10-CM | POA: Diagnosis not present

## 2022-09-03 DIAGNOSIS — Z01419 Encounter for gynecological examination (general) (routine) without abnormal findings: Secondary | ICD-10-CM | POA: Diagnosis not present

## 2022-09-03 DIAGNOSIS — Z1151 Encounter for screening for human papillomavirus (HPV): Secondary | ICD-10-CM | POA: Diagnosis not present

## 2022-09-03 DIAGNOSIS — Z1211 Encounter for screening for malignant neoplasm of colon: Secondary | ICD-10-CM | POA: Diagnosis not present

## 2022-09-03 DIAGNOSIS — D219 Benign neoplasm of connective and other soft tissue, unspecified: Secondary | ICD-10-CM

## 2022-09-03 LAB — HEMOCCULT GUIAC POC 1CARD (OFFICE): Fecal Occult Blood, POC: NEGATIVE

## 2022-09-03 NOTE — Progress Notes (Signed)
Patient ID: Anna West, female   DOB: 12/22/1965, 57 y.o.   MRN: [id number] History of Present Illness: Clint is a 57 year old black female,single, PM in for a well woman gyn exam and pap  PCP is Shirleen Schirmer NP   Current Medications, Allergies, Past Medical History, Past Surgical History, Family History and Social History were reviewed in [employer] record.     Review of Systems: Patient denies any headaches, hearing loss, fatigue, blurred vision, shortness of breath, chest pain, abdominal pain, problems with bowel movements, urination, or intercourse(has never had sex). No joint pain or mood swings.  She denies any vaginal bleeding.   Physical Exam:BP 122/77 (BP Location: Left Arm, Patient Position: Sitting, Cuff Size: Large)   Pulse (!) 106   Ht 4\' 10"  (1.473 m)   Wt 230 lb (104.3 kg)   BMI 48.07 kg/m   General:  Well developed, well nourished, no acute distress Skin:  Warm and dry Neck:  Midline trachea, normal thyroid, good ROM, no lymphadenopathy Lungs; Clear to auscultation bilaterally Breast:  No dominant palpable mass, retraction, or nipple discharge Cardiovascular: Regular rate and rhythm Abdomen:  Soft, non tender, no hepatosplenomegaly,obese Pelvic:  External genitalia is normal in appearance, no lesions.  The vagina is pale. Urethra has no lesions or masses. The cervix is poor seen, pap with HR HPV genotyping  performed.  Uterus is enlarged about 20 week size. No adnexal masses or tenderness noted.Bladder is non tender, no masses felt. Rectal: Good sphincter tone, no polyps, or hemorrhoids felt.  Hemoccult negative. Extremities/musculoskeletal:  No swelling or varicosities noted, no clubbing or cyanosis Psych:  No mood changes, alert and cooperative,seems happy Fall risk is low  [facility] - 09/03/22 1124       Pregnancy Intention Screening   Does the patient want to become pregnant in the next year? No    Does the patient's partner  want to become pregnant in the next year? No    Would the patient like to discuss contraceptive options today? No      Contraception Wrap Up   Current Method Abstinence    End Method Abstinence    Contraception Counseling Provided No            Examination chaperoned by Malachy Mood LPN   Impression and plan: 1. Encounter for gynecological examination with Papanicolaou smear of cervix Pap sent Pap in 3 years if normal Physical in 1 year Labs with PCP Get mammogram  - Cytology - PAP( [facility])  2. Encounter for screening fecal occult blood testing Hemoccult was negative  - POCT occult blood stool  3. Postmenopause Denies any vaginal bleeding   4. Enlarged uterus  5. Fibroids

## 2022-09-06 LAB — CYTOLOGY - PAP
Adequacy: ABSENT
Comment: NEGATIVE
Diagnosis: NEGATIVE
High risk HPV: NEGATIVE

## 2022-09-18 ENCOUNTER — Ambulatory Visit (HOSPITAL_COMMUNITY): Payer: Medicare Other

## 2022-09-19 ENCOUNTER — Ambulatory Visit (HOSPITAL_COMMUNITY)
Admission: RE | Admit: 2022-09-19 | Discharge: 2022-09-19 | Disposition: A | Discharge status: Home / Self Care | Payer: Medicare Other | Source: Ambulatory Visit | Attending: Adult Health | Admitting: Adult Health

## 2022-09-19 ENCOUNTER — Encounter (HOSPITAL_COMMUNITY): Payer: Self-pay | Admitting: Adult Health

## 2022-09-19 DIAGNOSIS — Z1231 Encounter for screening mammogram for malignant neoplasm of breast: Secondary | ICD-10-CM | POA: Diagnosis present

## 2022-09-25 ENCOUNTER — Other Ambulatory Visit (HOSPITAL_COMMUNITY): Payer: Self-pay | Admitting: Adult Health

## 2022-09-25 DIAGNOSIS — R928 Other abnormal and inconclusive findings on diagnostic imaging of breast: Secondary | ICD-10-CM

## 2022-10-08 ENCOUNTER — Ambulatory Visit (HOSPITAL_COMMUNITY)
Admission: RE | Admit: 2022-10-08 | Discharge: 2022-10-08 | Disposition: A | Discharge status: Home / Self Care | Payer: Medicare Other | Source: Ambulatory Visit | Attending: Adult Health | Admitting: Adult Health

## 2022-10-08 ENCOUNTER — Encounter (HOSPITAL_COMMUNITY): Payer: Self-pay

## 2022-10-08 DIAGNOSIS — R928 Other abnormal and inconclusive findings on diagnostic imaging of breast: Secondary | ICD-10-CM

## 2023-06-11 ENCOUNTER — Ambulatory Visit (INDEPENDENT_AMBULATORY_CARE_PROVIDER_SITE_OTHER): Payer: Medicaid Other | Admitting: Adult Health

## 2023-06-11 ENCOUNTER — Encounter: Payer: Self-pay | Admitting: Adult Health

## 2023-06-11 VITALS — BP 119/78 | HR 102 | Ht <= 58 in | Wt 208.5 lb

## 2023-06-11 DIAGNOSIS — D219 Benign neoplasm of connective and other soft tissue, unspecified: Secondary | ICD-10-CM

## 2023-06-11 DIAGNOSIS — I10 Essential (primary) hypertension: Secondary | ICD-10-CM | POA: Diagnosis not present

## 2023-06-11 DIAGNOSIS — N950 Postmenopausal bleeding: Secondary | ICD-10-CM | POA: Insufficient documentation

## 2023-06-11 DIAGNOSIS — N852 Hypertrophy of uterus: Secondary | ICD-10-CM

## 2023-06-11 NOTE — Progress Notes (Addendum)
  Subjective:     Patient ID: Anna West, female   DOB: [birth year], 58 y.o.   MRN: [id number]  HPI Anna West is a 58 year old black female, single, PM, in complaining of bleeding since last week.    Component Value Date/Time   DIAGPAP  09/03/2022 1132    - Negative for intraepithelial lesion or malignancy (NILM)   DIAGPAP  09/19/2020 1054    - Negative for intraepithelial lesion or malignancy (NILM)   HPVHIGH Negative 09/03/2022 1132   HPVHIGH Negative 09/19/2020 1054   ADEQPAP  09/03/2022 1132    Satisfactory for evaluation; transformation zone component ABSENT.   ADEQPAP  09/19/2020 1054    Satisfactory for evaluation; transformation zone component ABSENT.   PCP is [facility]   Review of Systems +PMB since last week Denies any pain Has never had sex Reviewed past medical,surgical, social and family history. Reviewed medications and allergies.     Objective:   Physical Exam BP 119/78 (BP Location: Right Arm, Patient Position: Sitting, Cuff Size: Normal)   Pulse (!) 102   Ht 4\' 10"  (1.473 m)   Wt 208 lb 8 oz (94.6 kg)   BMI 43.58 kg/m     Skin warm and dry.Pelvic: external genitalia is normal in appearance no lesions, vagina: +dark blood,urethra has no lesions or masses noted, cervix:poorly seen, appears smooth, uterus: is enlarged,non tender, +fibroid felt,adnexa: no masses or tenderness noted. Bladder is non tender and no masses felt.   Fall risk is low  Upstream - 06/11/23 1409       Pregnancy Intention Screening   Does the patient want to become pregnant in the next year? N/A    Does the patient's partner want to become pregnant in the next year? N/A    Would the patient like to discuss contraceptive options today? N/A      Contraception Wrap Up   Current Method Abstinence   PM   End Method Abstinence   PM   Contraception Counseling Provided No            Examination chaperoned by Malachy Mood LPN  Assessment:    1. PMB (postmenopausal bleeding)  (Primary) Bleeding x 1 week Will get pelvic US in office in am to assess uterine lining She knows if thickened will need biopsy  - US PELVIC COMPLETE WITH TRANSVAGINAL; Future  2. Enlarged uterus Uterus is enlarged - US PELVIC COMPLETE WITH TRANSVAGINAL; Future  3. Fibroids Has known fibroids  - US PELVIC COMPLETE WITH TRANSVAGINAL; Future  4. Hypertension Take meds and follow up with PCP    Plan:     Return in am for pelvic US at 8:30 am

## 2023-06-12 ENCOUNTER — Telehealth: Payer: Self-pay | Admitting: Adult Health

## 2023-06-12 ENCOUNTER — Other Ambulatory Visit: Payer: Self-pay | Admitting: Adult Health

## 2023-06-12 ENCOUNTER — Ambulatory Visit: Payer: Medicare Other

## 2023-06-12 DIAGNOSIS — N852 Hypertrophy of uterus: Secondary | ICD-10-CM | POA: Diagnosis not present

## 2023-06-12 DIAGNOSIS — N950 Postmenopausal bleeding: Secondary | ICD-10-CM

## 2023-06-12 DIAGNOSIS — D219 Benign neoplasm of connective and other soft tissue, unspecified: Secondary | ICD-10-CM

## 2023-06-12 DIAGNOSIS — I10 Essential (primary) hypertension: Secondary | ICD-10-CM

## 2023-06-12 NOTE — Telephone Encounter (Signed)
 [place] and [place] both know that US showed the fibroid, but that the endometrial lining is thickened and Khamiya needs the biopsy we talked about. The ECC was 8-12 mm. Will make appt with Dr Charlotta Newton for endometrial biopsy.

## 2023-06-12 NOTE — Progress Notes (Signed)
 PELVIC US TA ONLY: enlarged heterogeneous anteverted uterus VOL 860 ml,large submucosal fibroid with calcifications mid uterus distorting the endometrium to the right 12 x 8 x 9.5 cm,heterogeneous thickened endometrium 8-12 mm (limited view),normal ovaries,no free fluid,no pain during ultrasound

## 2023-06-19 ENCOUNTER — Ambulatory Visit: Payer: Medicare Other | Admitting: Obstetrics & Gynecology

## 2023-06-19 VITALS — BP 136/82 | HR 123 | Ht <= 58 in | Wt 210.4 lb

## 2023-06-19 DIAGNOSIS — N950 Postmenopausal bleeding: Secondary | ICD-10-CM | POA: Diagnosis not present

## 2023-06-19 DIAGNOSIS — D219 Benign neoplasm of connective and other soft tissue, unspecified: Secondary | ICD-10-CM

## 2023-06-19 NOTE — Progress Notes (Signed)
   GYN VISIT Patient name: Anna West MRN [id number]  Date of birth: Jan 24, 1966 Chief Complaint:   Procedure (Endo bx)  History of Present Illness:   Anna West is a 58 y.o. G0P0000 PM female being seen today for the following concerns:  PMB: Seen by J.Griffin 2/26 and noted bleeding the following week.  Bleeding seemed similar to a period though patient did not share any details.   Per mom- she has not had any bleeding for many years.  Reports no other acute complaints or concerns.  [place] 06/12/2023: 14.1 x 9.4 x 12.4 cm, Total uterine volume 860 HK:[id number] heterogeneous anteverted uterus VOL 860 ml,large submucosal fibroid with calcifications mid uterus distorting the endometrium to the right 12 x 8 x 9.5 cm  Endometrium appears 8-61mm in thickness. Normal ovaries bilaterally  No LMP recorded. Patient is postmenopausal.    Review of Systems:   Pertinent items are noted in HPI Denies fever/chills, dizziness, headaches, visual disturbances, fatigue, shortness of breath, chest pain, abdominal pain, vomiting, no problems with bowel movements, urination, or intercourse unless otherwise stated above.  Pertinent History Reviewed:  No past surgical history on file.  Past Medical History:  Diagnosis Date   Anemia    Coronary artery disease    GERD (gastroesophageal reflux disease)    Hypertension    Mentally challenged    Thyroid disease    Reviewed problem list, medications and allergies. Physical Assessment:   Vitals:   06/19/23 1327  BP: 136/82  Pulse: (!) 123  Weight: 210 lb 6.4 oz (95.4 kg)  Height: 4\' 10"  (1.473 m)  Body mass index is 43.97 kg/m.       Physical Examination:   General appearance: alert, well appearing, and in no distress  Psych: mood appropriate, normal affect  Skin: warm & dry   Cardiovascular: normal heart rate noted  Respiratory: normal respiratory effort, no distress  Abdomen: obese, soft, non-tender   Pelvic: VULVA: normal appearing vulva  with no masses, tenderness or lesions, VAGINA: normal appearing vagina with normal color and discharge, no lesions, CERVIX: difficulty with visualization due to pt discomfort and possible enlarged uterus UTERUS: enlarged, mobile-non-tender, exam limited due to body habitus  Extremities: 2+ edema  Chaperone: Faith Rogue    Endometrial Biopsy Procedure Note  Pre-operative Diagnosis: postmenopausal bleeding  Post-operative Diagnosis: same  Procedure Details   The risks (including infection, bleeding, pain, and uterine perforation) and benefits of the procedure were explained to the patient and Written informed consent was obtained.  Antibiotic prophylaxis against endocarditis was not indicated.   The patient was placed in the dorsal lithotomy position.  Bimanual exam showed the uterus to be in the neutral position.  A speculum inserted in the vagina.  Only able to visualize anterior portion of cervix- unable to open speculum.  Unclear if it was due to pain or position of the uterus.  Procedure unable to be completed.  Condition: Stable  Complications: None     Assessment & Plan:  1) Postmenopausal bleeding -reviewed recent US and recommendation to proceed with EMB -EMB attempted as above, unable to complete -plan to schedule for hysteroscopy, D&C  Orders Placed This Encounter  Procedures   Ambulatory Referral For Surgery Scheduling    Return for TBD- likely surgery April 8.   Myna Hidalgo, DO Attending Obstetrician & Gynecologist, [facility] for [employer], [facility]

## 2023-06-23 ENCOUNTER — Encounter: Payer: Self-pay | Admitting: Obstetrics & Gynecology

## 2023-07-17 NOTE — Patient Instructions (Signed)
 Anna West  07/17/2023     @PREFPERIOPPHARMACY @   Your procedure is scheduled on  07/22/2023.   Report to [facility] at  0700  A.M.   Call this number if you have problems the morning of surgery:  [phone]  If you experience any cold or flu symptoms such as cough, fever, chills, shortness of breath, etc. between now and your scheduled surgery, please notify us at the above number.   Remember:  Do not eat after midnight.   You may drink clear liquids until  0500 am on 07/22/2023.    Clear liquids allowed are:                    Water, Juice (No red color; non-citric and without pulp; diabetics please choose diet or no sugar options), Carbonated beverages (diabetics please choose diet or no sugar options), Clear Tea (No creamer, milk, or cream, including half & half and powdered creamer), Black Coffee Only (No creamer, milk or cream, including half & half and powdered creamer), and Clear Sports drink (No red color; diabetics please choose diet or no sugar options)    Take these medicines the morning of surgery with A SIP OF WATER                                      esopmeprazole, levothyroxine.    Do not wear jewelry, make-up or nail polish, including gel polish,  artificial nails, or any other type of covering on natural nails (fingers and  toes).  Do not wear lotions, powders, or perfumes, or deodorant.  Do not shave 48 hours prior to surgery.  Men may shave face and neck.  Do not bring valuables to the hospital.  [facility] is not responsible for any belongings or valuables.  Contacts, dentures or bridgework may not be worn into surgery.  Leave your suitcase in the car.  After surgery it may be brought to your room.  For patients admitted to the hospital, discharge time will be determined by your treatment team.  Patients discharged the day of surgery will not be allowed to drive home and must have someone with them for 24 hours.    Special instructions:  DO  NOT smoke tobacco or vape for 24 hours before your procedure.  Please read over the following fact sheets that you were given. Coughing and Deep Breathing, Surgical Site Infection Prevention, Anesthesia Post-op Instructions, and Care and Recovery After Surgery      Dilation and Curettage or Vacuum Curettage, Care After The following information offers guidance on how to care for yourself after your procedure. Your doctor may also give you more specific instructions. If you have problems or questions, contact your doctor. What can I expect after the procedure? After the procedure, it is common to have: Mild pain or cramps. Some bleeding or spotting from the vagina. These may last for up to 2 weeks. Follow these instructions at home: Medicines Take over-the-counter and prescription medicines only as told by your doctor. If told, take steps to prevent problems with pooping (constipation). You may need to: Drink enough fluid to keep your pee (urine) pale yellow. Take medicines. You will be told what medicines to take. Eat foods that are high in fiber. These include beans, whole grains, and fresh fruits and vegetables. Limit foods that are high in fat and  sugar. These include fried or sweet foods. Ask your doctor if you should avoid driving or using machines while you are taking your medicine. Activity  If you were given a medicine to help you relax (sedative) during your procedure, it can affect you for many hours. Do not drive or use machinery until your doctor says that it is safe. Rest as told by your doctor. Get up to take short walks every 1-2 hours. Ask for help if you feel weak or unsteady. Do not lift anything that is heavier than 10 lb (4.5 kg), or the limit that you are told. Return to your normal activities when your doctor says that it is safe. Lifestyle For at least 2 weeks, or as long as told by your doctor: Do not douche. Do not use tampons. Do not have sex. General  instructions Do not take baths, swim, or use a hot tub. Ask your doctor if you may take showers. Do not smoke or use any products that contain nicotine or tobacco. These can delay healing. If you need help quitting, ask your doctor. Wear compression stockings as told by your doctor. It is up to you to get the results of your procedure. Ask how to get your results when they are ready. Keep all follow-up visits. Contact a doctor if: You have very bad cramps that get worse or do not get better with medicine. You have very bad pain in your belly (abdomen). You cannot drink fluids without vomiting. You have pain in the area just above your thighs. You have fluid from your vagina that smells bad. You have a rash. Get help right away if: You are bleeding a lot from your vagina. This means soaking more than one sanitary pad in 1 hour, and this happens for 2 hours in a row. You have a fever that is above 100.61F (38C). Your belly feels very tender or hard. You have chest pain. You have trouble breathing. You feel dizzy or light-headed. You faint. You have pain in your neck or shoulder area. These symptoms may be an emergency. Get help right away. Call your local emergency services (911 in the [place] Do not wait to see if the symptoms will go away. Do not drive yourself to the hospital. Summary After your procedure, it is common to have pain or cramping. It is also common to have bleeding or spotting from your vagina. Rest as told. Get up to take short walks every 1-2 hours. Do not lift anything that is heavier than 10 lb (4.5 kg), or the limit that you are told. Get help right away if you have problems from the procedure. Ask your doctor what problems to watch for. This information is not intended to replace advice given to you by your health care provider. Make sure you discuss any questions you have with your health care provider. Document Revised: 03/20/2020 Document Reviewed:  03/22/2020 Elsevier Patient Education  2024 Else[employer] Inc[employer]How to Use Chlorhexidine at Home in the Shower Chlorhexidine gluconate (CHG) is a germ-killing (antiseptic) wash that's used to clean the skin. It can get rid of the germs that normally live on the skin and can keep them away for about 24 hours. If you're having surgery, you may be told to shower with CHG at home the night before surgery. This can help lower your risk for infection. To use CHG wash in the shower, follow the steps below. Supplies needed: CHG body wash. Clean washcloth. Clean towel. How to use CHG in  the shower Follow these steps unless you're told to use CHG in a different way: Start the shower. Use your normal soap and shampoo to wash your face and hair. Turn off the shower or move out of the shower stream. Pour CHG onto a clean washcloth. Do not use any type of brush or rough sponge. Start at your neck, washing your body down to your toes. Make sure you: Wash the part of your body where the surgery will be done for at least 1 minute. Do not scrub. Do not use CHG on your head or face unless your health care provider tells you to. If it gets into your ears or eyes, rinse them well with water. Do not wash your genitals with CHG. Wash your back and under your arms. Make sure to wash skin folds. Let the CHG sit on your skin for 1-2 minutes or as long as told. Rinse your entire body in the shower, including all body creases and folds. Turn off the shower. Dry off with a clean towel. Do not put anything on your skin afterward, such as powder, lotion, or perfume. Put on clean clothes or pajamas. If it's the night before surgery, sleep in clean sheets. General tips Use CHG only as told, and follow the instructions on the label. Use the full amount of CHG as told. This is often one bottle. Do not smoke and stay away from flames after using CHG. Your skin may feel sticky after using CHG. This is normal. The sticky feeling  will go away as the CHG dries. Do not use CHG: If you have a chlorhexidine allergy or have reacted to chlorhexidine in the past. On open wounds or areas of skin that have broken skin, cuts, or scrapes. On babies younger than 65 months of age. Contact a health care provider if: You have questions about using CHG. Your skin gets irritated or itchy. You have a rash after using CHG. You swallow any CHG. Call your local poison control center [phone] in the [place] Your eyes itch badly, or they become very red or swollen. Your hearing changes. You have trouble seeing. If you can't reach your provider, go to an urgent care or emergency room. Do not drive yourself. Get help right away if: You have swelling or tingling in your mouth or throat. You make high-pitched whistling sounds when you breathe, most often when you breathe out (wheeze). You have trouble breathing. These symptoms may be an emergency. Call 911 right away. Do not wait to see if the symptoms will go away. Do not drive yourself to the hospital. This information is not intended to replace advice given to you by your health care provider. Make sure you discuss any questions you have with your health care provider. Document Revised: 10/15/2022 Document Reviewed: 10/11/2021 Elsevier Patient Education  2024 [employer].

## 2023-07-18 ENCOUNTER — Encounter (HOSPITAL_COMMUNITY)
Admission: RE | Admit: 2023-07-18 | Discharge: 2023-07-18 | Disposition: A | Discharge status: Home / Self Care | Source: Ambulatory Visit | Attending: Obstetrics & Gynecology | Admitting: Obstetrics & Gynecology

## 2023-07-18 ENCOUNTER — Encounter (HOSPITAL_COMMUNITY): Payer: Self-pay

## 2023-07-18 VITALS — BP 117/76 | HR 104 | Temp 98.0°F | Resp 18 | Ht <= 58 in | Wt 210.3 lb

## 2023-07-18 DIAGNOSIS — Z01818 Encounter for other preprocedural examination: Secondary | ICD-10-CM | POA: Diagnosis present

## 2023-07-18 DIAGNOSIS — N950 Postmenopausal bleeding: Secondary | ICD-10-CM | POA: Diagnosis not present

## 2023-07-18 DIAGNOSIS — I10 Essential (primary) hypertension: Secondary | ICD-10-CM | POA: Diagnosis not present

## 2023-07-18 DIAGNOSIS — Z79899 Other long term (current) drug therapy: Secondary | ICD-10-CM | POA: Insufficient documentation

## 2023-07-18 LAB — CBC
HCT: 34 % — ABNORMAL LOW (ref 36.0–46.0)
Hemoglobin: 11.1 g/dL — ABNORMAL LOW (ref 12.0–15.0)
MCH: 30.1 pg (ref 26.0–34.0)
MCHC: 32.6 g/dL (ref 30.0–36.0)
MCV: 92.1 fL (ref 80.0–100.0)
Platelets: 335 K/uL (ref 150–400)
RBC: 3.69 MIL/uL — ABNORMAL LOW (ref 3.87–5.11)
RDW: 13.9 % (ref 11.5–15.5)
WBC: 5 K/uL (ref 4.0–10.5)
nRBC: 0 % (ref 0.0–0.2)

## 2023-07-18 LAB — BASIC METABOLIC PANEL WITH GFR
Anion gap: 10 (ref 5–15)
BUN: 13 mg/dL (ref 6–20)
CO2: 25 mmol/L (ref 22–32)
Calcium: 9.3 mg/dL (ref 8.9–10.3)
Chloride: 101 mmol/L (ref 98–111)
Creatinine, Ser: 0.7 mg/dL (ref 0.44–1.00)
GFR, Estimated: >60 mL/min (ref >60)
Glucose, Bld: 109 mg/dL — ABNORMAL HIGH (ref 70–99)
Potassium: 3.4 mmol/L — ABNORMAL LOW (ref 3.5–5.1)
Sodium: 136 mmol/L (ref 135–145)

## 2023-07-18 NOTE — H&P (Signed)
 [facility] Attending History and Physical  Anna West is a 58 y.o. G0P0000 PM who presents for scheduled hysteroscopy, D&C due to postmenopausal bleeding  In review, pt notes back in Feb she had bleeding that was similar to a period.  History obtained per family member.She has not had any bleeding for many years.  Reports no other acute complaints or concerns.   In office EMB attempted, but unable to complete, she presents today for further work up  Denies any abnormal vaginal discharge, fevers, chills, sweats, dysuria, nausea, vomiting, other GI or GU symptoms or other general symptoms.  Past Medical History:  Diagnosis Date   Anemia    Coronary artery disease    GERD (gastroesophageal reflux disease)    Hypertension    Mentally challenged    Thyroid disease    No past surgical history on file. OB History  Gravida Para Term Preterm AB Living  0 0 0 0 0 0  SAB IAB Ectopic Multiple Live Births  0 0 0 0 0  Patient denies any other pertinent gynecologic issues.  No current facility-administered medications on file prior to encounter.   Current Outpatient Medications on File Prior to Encounter  Medication Sig Dispense Refill   esomeprazole (NEXIUM) 20 MG capsule Take 20 mg by mouth daily.     hydrochlorothiazide (HYDRODIURIL) 25 MG tablet Take 25 mg by mouth daily.     levothyroxine (SYNTHROID) 75 MCG tablet Take 75 mcg by mouth.     pravastatin (PRAVACHOL) 80 MG tablet Take 80 mg by mouth daily.     No Known Allergies  Social History:   reports that she has never smoked. She has never used smokeless tobacco. She reports that she does not drink alcohol and does not use drugs. Family History  Problem Relation Age of Onset   Cancer Father    Bone cancer Father    Hypertension Mother     Review of Systems: Pertinent items noted in HPI and remainder of comprehensive ROS otherwise negative.  PHYSICAL EXAM: Blood pressure 131/72, pulse (!)  104, temperature 98.3 F (36.8 C), temperature source Oral, resp. rate (!) 22, SpO2 100%. CONSTITUTIONAL: Well-developed, well-nourished female in no acute distress.  SKIN: Skin is warm and dry. No rash noted. Not diaphoretic. No erythema. No pallor. NEUROLOGIC: Alert and oriented to person, place, and time. Normal reflexes, muscle tone coordination. No cranial nerve deficit noted. PSYCHIATRIC: Normal mood and affect. Normal behavior. Normal judgment and thought content. CARDIOVASCULAR: Normal heart rate noted, regular rhythm RESPIRATORY: Effort and breath sounds normal, no problems with respiration noted ABDOMEN: Soft, nontender, nondistended. PELVIC: deferred MUSCULOSKELETAL: no calf tenderness bilaterally EXT: no edema bilaterally, normal pulses  Labs: Results for orders placed or performed during the hospital encounter of 07/18/23 (from the past 2 weeks)  Basic metabolic panel   Collection Time: 07/18/23  9:47 AM  Result Value Ref Range   Sodium 136 135 - 145 mmol/L   Potassium 3.4 (L) 3.5 - 5.1 mmol/L   Chloride 101 98 - 111 mmol/L   CO2 25 22 - 32 mmol/L   Glucose, Bld 109 (H) 70 - 99 mg/dL   BUN 13 6 - 20 mg/dL   Creatinine, Ser 4.74 0.44 - 1.00 mg/dL   Calcium 9.3 8.9 - 25.9 mg/dL   GFR, Estimated >56 >38 mL/min   Anion gap 10 5 - 15  CBC   Collection Time: 07/18/23  9:47 AM  Result Value Ref Range  WBC 5.0 4.0 - 10.5 K/uL   RBC 3.69 (L) 3.87 - 5.11 MIL/uL   Hemoglobin 11.1 (L) 12.0 - 15.0 g/dL   HCT 09.8 (L) 11.9 - 14.7 %   MCV 92.1 80.0 - 100.0 fL   MCH 30.1 26.0 - 34.0 pg   MCHC 32.6 30.0 - 36.0 g/dL   RDW 82.9 56.2 - 13.0 %   Platelets 335 150 - 400 K/uL   nRBC 0.0 0.0 - 0.2 %    Imaging Studies: [place] 06/12/2023: 14.1 x 9.4 x 12.4 cm, Total uterine volume 860 QM:VHQIONGE heterogeneous anteverted uterus VOL 860 ml,large submucosal fibroid with calcifications mid uterus distorting the endometrium to the right 12 x 8 x 9.5 cm  Endometrium appears 8-24mm in  thickness. Normal ovaries bilaterally  Assessment: Postmenopausal bleeding Thickened endometrium   Plan: Hysteroscopy, D&C with possible intervention -NPO -LR @ 125cc/hr -SCDs to OR -Risk/benefits and alternatives reviewed with the patient including but not limited to risk of bleeding, infection and injury to surrounding organs due to uterine perforation requiring further surgical intervention.  Questions and concerns were addressed and pt desires to proceed  Anna Hidalgo, DO Attending Obstetrician & Gynecologist, [facility] for [facility], [facility]

## 2023-07-22 ENCOUNTER — Ambulatory Visit (HOSPITAL_COMMUNITY): Admitting: Anesthesiology

## 2023-07-22 ENCOUNTER — Ambulatory Visit (HOSPITAL_BASED_OUTPATIENT_CLINIC_OR_DEPARTMENT_OTHER): Admitting: Anesthesiology

## 2023-07-22 ENCOUNTER — Encounter (HOSPITAL_COMMUNITY): Payer: Self-pay | Admitting: Obstetrics & Gynecology

## 2023-07-22 ENCOUNTER — Encounter (HOSPITAL_COMMUNITY)
Admission: RE | Disposition: A | Discharge status: Home / Self Care | Payer: Self-pay | Source: Home / Self Care | Attending: Obstetrics & Gynecology

## 2023-07-22 ENCOUNTER — Ambulatory Visit (HOSPITAL_COMMUNITY)
Admission: RE | Admit: 2023-07-22 | Discharge: 2023-07-22 | Disposition: A | Discharge status: Home / Self Care | Attending: Obstetrics & Gynecology | Admitting: Obstetrics & Gynecology

## 2023-07-22 DIAGNOSIS — I2510 Atherosclerotic heart disease of native coronary artery without angina pectoris: Secondary | ICD-10-CM | POA: Insufficient documentation

## 2023-07-22 DIAGNOSIS — D259 Leiomyoma of uterus, unspecified: Secondary | ICD-10-CM | POA: Diagnosis not present

## 2023-07-22 DIAGNOSIS — E66813 Obesity, class 3: Secondary | ICD-10-CM | POA: Insufficient documentation

## 2023-07-22 DIAGNOSIS — N950 Postmenopausal bleeding: Secondary | ICD-10-CM

## 2023-07-22 DIAGNOSIS — I10 Essential (primary) hypertension: Secondary | ICD-10-CM

## 2023-07-22 DIAGNOSIS — K219 Gastro-esophageal reflux disease without esophagitis: Secondary | ICD-10-CM | POA: Diagnosis not present

## 2023-07-22 DIAGNOSIS — R9389 Abnormal findings on diagnostic imaging of other specified body structures: Secondary | ICD-10-CM | POA: Diagnosis not present

## 2023-07-22 DIAGNOSIS — Z6841 Body Mass Index (BMI) 40.0 and over, adult: Secondary | ICD-10-CM | POA: Insufficient documentation

## 2023-07-22 DIAGNOSIS — E785 Hyperlipidemia, unspecified: Secondary | ICD-10-CM

## 2023-07-22 DIAGNOSIS — D219 Benign neoplasm of connective and other soft tissue, unspecified: Secondary | ICD-10-CM | POA: Diagnosis present

## 2023-07-22 HISTORY — PX: HYSTEROSCOPY WITH D & C: SHX1775

## 2023-07-22 SURGERY — DILATATION AND CURETTAGE /HYSTEROSCOPY
Anesthesia: General | Site: Cervix

## 2023-07-22 MED ORDER — ORAL CARE MOUTH RINSE: 15.0000 mL | Freq: Once | OROMUCOSAL | Status: AC

## 2023-07-22 MED ORDER — OXYCODONE HCL 5 MG PO TABS: 5.0000 mg | ORAL_TABLET | Freq: Once | ORAL | Status: DC | PRN

## 2023-07-22 MED ORDER — PROPOFOL 10 MG/ML IV BOLUS
INTRAVENOUS | Status: DC | PRN
  Administered 2023-07-22: 150 mg via INTRAVENOUS

## 2023-07-22 MED ORDER — PHENYLEPHRINE 80 MCG/ML (10ML) SYRINGE FOR IV PUSH (FOR BLOOD PRESSURE SUPPORT)
PREFILLED_SYRINGE | INTRAVENOUS | Status: DC | PRN
  Administered 2023-07-22 (×4): 160 ug via INTRAVENOUS

## 2023-07-22 MED ORDER — ONDANSETRON HCL 4 MG/2ML IJ SOLN
INTRAMUSCULAR | Status: DC | PRN
  Administered 2023-07-22: 4 mg via INTRAVENOUS

## 2023-07-22 MED ORDER — CHLORHEXIDINE GLUCONATE 0.12 % MT SOLN: 15.0000 mL | Freq: Once | OROMUCOSAL | Status: DC

## 2023-07-22 MED ORDER — ONDANSETRON HCL 4 MG/2ML IJ SOLN: 4.0000 mg | Freq: Once | INTRAMUSCULAR | Status: DC | PRN

## 2023-07-22 MED ORDER — GLYCOPYRROLATE PF 0.2 MG/ML IJ SOSY
PREFILLED_SYRINGE | INTRAMUSCULAR | Status: DC | PRN
  Administered 2023-07-22: .2 mg via INTRAVENOUS

## 2023-07-22 MED ORDER — KETOROLAC TROMETHAMINE 30 MG/ML IJ SOLN
INTRAMUSCULAR | Status: DC | PRN
  Administered 2023-07-22: 30 mg via INTRAVENOUS

## 2023-07-22 MED ORDER — CHLORHEXIDINE GLUCONATE 0.12 % MT SOLN
15.0000 mL | Freq: Once | OROMUCOSAL | Status: AC
  Administered 2023-07-22: 15 mL via OROMUCOSAL

## 2023-07-22 MED ORDER — FENTANYL CITRATE (PF) 100 MCG/2ML IJ SOLN
INTRAMUSCULAR | Status: AC
  Filled 2023-07-22: qty 2

## 2023-07-22 MED ORDER — LACTATED RINGERS IV SOLN: INTRAVENOUS | Status: DC

## 2023-07-22 MED ORDER — FENTANYL CITRATE PF 50 MCG/ML IJ SOSY: 25.0000 ug | PREFILLED_SYRINGE | INTRAMUSCULAR | Status: DC | PRN

## 2023-07-22 MED ORDER — MIDAZOLAM HCL 2 MG/2ML IJ SOLN
INTRAMUSCULAR | Status: AC
  Filled 2023-07-22: qty 2

## 2023-07-22 MED ORDER — EPHEDRINE SULFATE-NACL 50-0.9 MG/10ML-% IV SOSY
PREFILLED_SYRINGE | INTRAVENOUS | Status: DC | PRN
  Administered 2023-07-22: 10 mg via INTRAVENOUS

## 2023-07-22 MED ORDER — ORAL CARE MOUTH RINSE: 15.0000 mL | Freq: Once | OROMUCOSAL | Status: DC

## 2023-07-22 MED ORDER — FENTANYL CITRATE (PF) 100 MCG/2ML IJ SOLN
INTRAMUSCULAR | Status: DC | PRN
  Administered 2023-07-22: 100 ug via INTRAVENOUS

## 2023-07-22 MED ORDER — MIDAZOLAM HCL 2 MG/2ML IJ SOLN
INTRAMUSCULAR | Status: DC | PRN
  Administered 2023-07-22: 2 mg via INTRAVENOUS

## 2023-07-22 MED ORDER — SODIUM CHLORIDE 0.9 % IR SOLN
Status: DC | PRN
  Administered 2023-07-22: 1000 mL

## 2023-07-22 MED ORDER — OXYCODONE HCL 5 MG/5ML PO SOLN: 5.0000 mg | Freq: Once | ORAL | Status: DC | PRN

## 2023-07-22 MED ORDER — KETOROLAC TROMETHAMINE 30 MG/ML IJ SOLN
30.0000 mg | INTRAMUSCULAR | Status: AC
  Administered 2023-07-22: 30 mg via INTRAVENOUS
  Filled 2023-07-22: qty 1

## 2023-07-22 SURGICAL SUPPLY — 26 items
CLOTH BEACON ORANGE TIMEOUT ST (SAFETY) ×1 IMPLANT
COVER LIGHT HANDLE STERIS (MISCELLANEOUS) ×3 IMPLANT
DILATOR CANAL MILEX (MISCELLANEOUS) IMPLANT
GAUZE 4X4 16PLY ~~LOC~~+RFID DBL (SPONGE) ×2 IMPLANT
GLOVE BIO SURGEON STRL SZ 6.5 (GLOVE) ×1 IMPLANT
GLOVE BIOGEL PI IND STRL 7.0 (GLOVE) ×3 IMPLANT
GOWN STRL REUS W/ TWL LRG LVL3 (GOWN DISPOSABLE) ×1 IMPLANT
GOWN STRL REUS W/TWL LRG LVL3 (GOWN DISPOSABLE) ×1 IMPLANT
KIT PROCEDURE FLUENT (KITS) ×1 IMPLANT
KIT TURNOVER CYSTO (KITS) ×1 IMPLANT
KIT TURNOVER KIT A (KITS) ×1 IMPLANT
NDL HYPO 18GX1.5 BLUNT FILL (NEEDLE) ×1 IMPLANT
NEEDLE HYPO 18GX1.5 BLUNT FILL (NEEDLE) ×1 IMPLANT
NS IRRIG 1000ML POUR BTL (IV SOLUTION) ×1 IMPLANT
PACK PERI GYN (CUSTOM PROCEDURE TRAY) ×1 IMPLANT
PAD ARMBOARD POSITIONER FOAM (MISCELLANEOUS) ×1 IMPLANT
PAD TELFA 3X4 1S STER (GAUZE/BANDAGES/DRESSINGS) ×1 IMPLANT
POSITIONER HEAD 8X9X4 ADT (SOFTGOODS) ×1 IMPLANT
SEAL ROD LENS SCOPE MYOSURE (ABLATOR) ×1 IMPLANT
SET BASIN LINEN APH (SET/KITS/TRAYS/PACK) ×1 IMPLANT
SOL .9 NS 3000ML IRR UROMATIC (IV SOLUTION) ×1 IMPLANT
SOL PREP POV-IOD 4OZ 10% (MISCELLANEOUS) ×1 IMPLANT
SYR 30ML LL (SYRINGE) ×1 IMPLANT
SYR CONTROL 10ML LL (SYRINGE) ×1 IMPLANT
TOWEL OR 17X26 4PK STRL BLUE (TOWEL DISPOSABLE) ×1 IMPLANT
UNDERPAD 30X36 HEAVY ABSORB (UNDERPADS AND DIAPERS) ×1 IMPLANT

## 2023-07-22 NOTE — Anesthesia Preprocedure Evaluation (Addendum)
 Anesthesia Evaluation  Patient identified by MRN, date of birth, ID band Patient awake    Reviewed: Allergy & Precautions, H&P , NPO status , Patient's Chart, lab work & pertinent test results, reviewed documented beta blocker date and time   Airway Mallampati: II  TM Distance: >3 FB Neck ROM: full    Dental no notable dental hx.    Pulmonary neg pulmonary ROS   Pulmonary exam normal breath sounds clear to auscultation       Cardiovascular Exercise Tolerance: Good hypertension, + CAD   Rhythm:regular Rate:Normal     Neuro/Psych negative neurological ROS  negative psych ROS   GI/Hepatic negative GI ROS, Neg liver ROS,GERD  ,,  Endo/Other    Class 3 obesity  Renal/GU negative Renal ROS  negative genitourinary   Musculoskeletal   Abdominal   Peds  Hematology negative hematology ROS (+) Blood dyscrasia, anemia   Anesthesia Other Findings   Reproductive/Obstetrics negative OB ROS                             Anesthesia Physical Anesthesia Plan  ASA: 3  Anesthesia Plan: General and General LMA   Post-op Pain Management:    Induction:   PONV Risk Score and Plan: Ondansetron  Airway Management Planned:   Additional Equipment:   Intra-op Plan:   Post-operative Plan:   Informed Consent: I have reviewed the patients History and Physical, chart, labs and discussed the procedure including the risks, benefits and alternatives for the proposed anesthesia with the patient or authorized representative who has indicated his/her understanding and acceptance.     Dental Advisory Given  Plan Discussed with: CRNA  Anesthesia Plan Comments:        Anesthesia Quick Evaluation

## 2023-07-22 NOTE — Transfer of Care (Signed)
 Immediate Anesthesia Transfer of Care Note  Patient: Anna West  Procedure(s) Performed: DILATATION AND CURETTAGE /HYSTEROSCOPY (Cervix)  Patient Location: PACU  Anesthesia Type:General  Level of Consciousness: awake, alert , oriented, and patient cooperative  Airway & Oxygen Therapy: Patient Spontanous Breathing and Patient connected to face mask oxygen  Post-op Assessment: Report given to RN, Post -op Vital signs reviewed and stable, and Patient moving all extremities X 4  Post vital signs: Reviewed and stable  Last Vitals:  Vitals Value Taken Time  BP 91/60 07/22/23 0933  Temp 36.6 C 07/22/23 0932  Pulse 102 07/22/23 0934  Resp 16 07/22/23 0934  SpO2 100 % 07/22/23 0934  Vitals shown include unfiled device data.  Last Pain:  Vitals:   07/22/23 0757  TempSrc: Oral  PainSc: 0-No pain      Patients Stated Pain Goal: 5 (07/22/23 0757)  Complications: No notable events documented.

## 2023-07-22 NOTE — Progress Notes (Signed)
 Transferred to phase ii.

## 2023-07-22 NOTE — Op Note (Signed)
 Operative Report  PreOp: postmenopausal bleeding PostOp: same Procedure:  Hysteroscopy, Dilation and Curettage Surgeon: Dr. Myna Hidalgo Anesthesia: General Complications:none EBL: Minimal IVF:700 Discrepancy: 145  Findings: Narrow introitus with difficulty visualizing cervix.  Enlarged anteflexed uterus, unable to visualize endometrial cavity.  Specimens: endometrial curettings  Procedure: The patient was taken to the operating room where she underwent general anesthesia without difficulty. The patient was placed in a low lithotomy position using Allen stirrups. She was then prepped and draped in the normal sterile fashion. A small pediatric speculum was used due to narrowed introitus.  With some difficulty the cervix was visualized and a single tooth tenaculum was placed on the anterior lip of the cervix.  Due to the flex of the uterus an os finder was used to obtain entry into the endometrial canal.  Due to the sharp anteflexion and limited mobility of the uterus, uterine sound and dilators could not be used.  Ureteral dilators were used to serial dilate the canal.  The hysteroscope was inserted; however, with further entry the canal narrowed and there was concern for a false endometrial track.  The hysteroscope was removed.  Again the os finder was used to attempt entry into the uterine cavity.  Hysteroscope was inserted and there was no improvement of visualization into the endometrial cavity.  The hysteroscope was removed.  With significant flex of the curet, sharp curettage was completed with difficulty due to patient's body habitus including narrow vaginal canal.  The tissue was sent to pathology.  Concern for inadequate specimen based on sample size, positioning of the uterus and limited hysteroscope visualization.  All instrument were then removed. Hemostasis was observed at the cervical site. The patient was repositioned to the supine position. The patient tolerated the procedure without  any complications and taken to recovery in stable condition.  Should further surgical intervention be required would not consider pt a candidate for vaginal or laparoscopic approach.  Concern for inability to adequate place a uterine manipulator.  Myna Hidalgo, DO Attending Obstetrician & Gynecologist, [facility] for [employer], [facility]

## 2023-07-22 NOTE — Anesthesia Procedure Notes (Signed)
 Procedure Name: LMA Insertion Date/Time: 07/22/2023 8:48 AM  Performed by: Shanon Payor, CRNAPre-anesthesia Checklist: Patient identified, Emergency Drugs available, Suction available, Patient being monitored and Timeout performed Patient Re-evaluated:Patient Re-evaluated prior to induction Oxygen Delivery Method: Circle system utilized Preoxygenation: Pre-oxygenation with 100% oxygen Induction Type: IV induction LMA: LMA inserted LMA Size: 4.0 Number of attempts: 1 Placement Confirmation: positive ETCO2, CO2 detector and breath sounds checked- equal and bilateral Tube secured with: Tape Dental Injury: Teeth and Oropharynx as per pre-operative assessment

## 2023-07-22 NOTE — Discharge Instructions (Addendum)
 HOME INSTRUCTIONS  Please note any unusual or excessive bleeding, pain, swelling. Mild dizziness or drowsiness are normal for about 24 hours after surgery.   Shower when comfortable  Restrictions: No driving for 24 hours or while taking pain medications.  Activity:  You may return to your regular activity. Vaginal spotting is expected but if your bleeding is heavy, period like,  please call the office   Diet:  You may return to your regular diet.  Do not eat large meals.  Eat small frequent meals throughout the day.  Continue to drink a good amount of water at least 6-8 glasses of water per day, hydration is very important for the healing process.  Pain Management: Take over the counter tylenol or ibuprofen as needed for pain.  You can either take one or alternate between the two medications for pain management.  You may also use a heating pack as needed.    Alcohol -- Avoid for 24 hours and while taking pain medications.  Nausea: Take sips of ginger ale or soda  Fever -- Call physician if temperature over 101 degrees  Follow up:  If you do not already have a follow up appointment scheduled, please call the office at [phone].  If you experience fever (a temperature greater than 100.4), pain unrelieved by pain medication, shortness of breath, swelling of a single leg, or any other symptoms which are concerning to you please the office immediately.     Post Anesthesia Home Care Instructions  Activity: Get plenty of rest for the remainder of the day. A responsible individual must stay with you for 24 hours following the procedure.  For the next 24 hours, DO NOT: -Drive a car -Advertising copywriter -Drink alcoholic beverages -Take any medication unless instructed by your physician -Make any legal decisions or sign important papers.  Meals: Start with liquid foods such as gelatin or soup. Progress to regular foods as tolerated. Avoid greasy, spicy, heavy foods. If nausea and/or  vomiting occur, drink only clear liquids until the nausea and/or vomiting subsides. Call your physician if vomiting continues.  Special Instructions/Symptoms: Your throat may feel dry or sore from the anesthesia or the breathing tube placed in your throat during surgery. If this causes discomfort, gargle with warm salt water. The discomfort should disappear within 24 hours.    No NSAIDS (Ibuprofen, Advil, Aleve) until after 3:30 p.m.

## 2023-07-23 ENCOUNTER — Encounter (HOSPITAL_COMMUNITY): Payer: Self-pay | Admitting: Obstetrics & Gynecology

## 2023-07-23 LAB — SURGICAL PATHOLOGY

## 2023-07-23 NOTE — Anesthesia Postprocedure Evaluation (Signed)
 Anesthesia Post Note  Patient: Anna West  Procedure(s) Performed: DILATATION AND CURETTAGE /HYSTEROSCOPY (Cervix)  Patient location during evaluation: Phase II Anesthesia Type: General Level of consciousness: awake Pain management: pain level controlled Vital Signs Assessment: post-procedure vital signs reviewed and stable Respiratory status: spontaneous breathing and respiratory function stable Cardiovascular status: blood pressure returned to baseline and stable Postop Assessment: no headache and no apparent nausea or vomiting Anesthetic complications: no Comments: Late entry   No notable events documented.   Last Vitals:  Vitals:   07/22/23 1005 07/22/23 1007  BP:  124/82  Pulse:    Resp:  20  Temp: 36.6 C   SpO2:  100%    Last Pain:  Vitals:   07/22/23 1005  TempSrc: Oral  PainSc: 0-No pain                 Windell Norfolk

## 2023-08-06 ENCOUNTER — Encounter: Payer: Self-pay | Admitting: Obstetrics & Gynecology

## 2023-08-06 ENCOUNTER — Ambulatory Visit: Admitting: Obstetrics & Gynecology

## 2023-08-06 VITALS — BP 118/80 | HR 99 | Ht <= 58 in | Wt 206.0 lb

## 2023-08-06 DIAGNOSIS — N950 Postmenopausal bleeding: Secondary | ICD-10-CM

## 2023-08-06 DIAGNOSIS — Z01818 Encounter for other preprocedural examination: Secondary | ICD-10-CM

## 2023-08-06 DIAGNOSIS — D219 Benign neoplasm of connective and other soft tissue, unspecified: Secondary | ICD-10-CM

## 2023-08-06 NOTE — Progress Notes (Signed)
 GYN VISIT Patient name: Anna West MRN [id number]  Date of birth: 1965-07-27 Chief Complaint:   Follow-up  History of Present Illness:   Anna West is a 58 y.o. G0P0000 PM female being seen today for follow up regarding the following   PMB: In review, pt was seen due to bleeding back in Feb.  It lasted for about a week back in Feb- using a few pads per day.  Bleeding was BRB and then bleeding had resolved.  She denies any further episodes of bleeding.  She denies vaginal discharge, itching or irritation.  She reports no acute GYN concerns  Due to this bleeding workup was completed including a pelvic ultrasound which showed findings concerning for thickened endometrium.  An in office endometrial biopsy was attempted and unable to complete due to patient discomfort as well as positioning of the uterus.  Patient then underwent hysteroscopy D&C with me on July 22, 2023.  Due to the positioning of her uterus along with the large posterior uterine fibroid, I was unable to complete an adequate endometrial biopsy.  I discussed with patient and her mother that in general the procedure was challenging due to narrowed introitus, large uterus with fibroid as well as the positioning of her endometrium.  She presents today to review the next step  No LMP recorded. Patient is postmenopausal.    Review of Systems:   Pertinent items are noted in HPI Denies fever/chills, dizziness, headaches, visual disturbances, fatigue, shortness of breath, chest pain, abdominal pain, vomiting. Pertinent History Reviewed:   Past Surgical History:  Procedure Laterality Date   HYSTEROSCOPY WITH D & C N/A 07/22/2023   Procedure: DILATATION AND CURETTAGE /HYSTEROSCOPY;  Surgeon: Pius Byrom, DO;  Location: [facility];  Service: Gynecology;  Laterality: N/A;   MULTIPLE TOOTH EXTRACTIONS      Past Medical History:  Diagnosis Date   Anemia    Coronary artery disease    GERD (gastroesophageal reflux disease)     Hypertension    Mentally challenged    Thyroid  disease    Reviewed problem list, medications and allergies. Physical Assessment:   Vitals:   08/06/23 0904 08/06/23 0932  BP: (!) 142/89 118/80  Pulse: (!) 101 99  Weight: 206 lb (93.4 kg)   Height: 4\' 10"  (1.473 m)   Body mass index is 43.05 kg/m.       Physical Examination:   General appearance: alert, well appearing, and in no distress  Psych: mood appropriate, normal affect  Skin: warm & dry   Cardiovascular: normal heart rate noted  Respiratory: normal respiratory effort, no distress  Abdomen: obese, soft, non-tender   Pelvic: examination not indicated  Extremities: no edema   Chaperone: N/A    Assessment & Plan:  1) Postmenopausal bleeding, failed D&C -Discussed that at this time, work up for ruling out endometrial carcinoma is incomplete -Based on risk factors such as obesity would recommend consideration for hysterectomy and bilateral salpingectomy --Discussed plan for robotic- assisted laparoscopic hysterectomy with myomectomy and bilateral salpingectomy -Explained that this surgery is performed to remove the uterus through several small incisions in the abdomen- pending anatomy 4-5 ports. I discussed the risks and benefits of the surgery, including, but not limited to risk of bleeding, including the need for blood transfusion, infection, damage to surrounding organs and tissues such as damage to bladder, ureter or bowel that would requiring additional procedures.  Reviewed long term complications such as fistula or dehiscence requiring further surgical intervention.  Discussed possible  need for conversion to an open procedure and potential for other complications that cannot be predicted or prevented including DVT, PE or death -reviewed same day procedure -typical recovery 8-12 wks with several weeks off work and 8wks of pelvic rest -Also discussed alternative option, which would be not to complete a hysterectomy and rather  follow bleeding pattern.  In that in the future should she note any bleeding would consider immediately proceeding with hysterectomy - Questions and concerns were addressed and at this time she does not desire to proceed with surgical intervention - Referral created - Medication list reviewed  Majority of today's visit was spent in consultation reviewing the above information >62min  No orders of the defined types were placed in this encounter.   Return for tbd-preop.   Manali Mcelmurry, DO Attending Obstetrician & Gynecologist, [facility] for [employer], [facility]

## 2023-08-11 ENCOUNTER — Encounter: Payer: Self-pay | Admitting: Obstetrics & Gynecology

## 2023-09-02 ENCOUNTER — Other Ambulatory Visit (HOSPITAL_COMMUNITY): Payer: Self-pay | Admitting: Adult Health

## 2023-09-02 DIAGNOSIS — N63 Unspecified lump in unspecified breast: Secondary | ICD-10-CM

## 2023-09-18 NOTE — Patient Instructions (Signed)
 Anna West  09/18/2023     @PREFPERIOPPHARMACY @   Your procedure is scheduled on  09/24/2023.   Report to Cristine Done at  [phone] A.M.   Call this number if you have problems the morning of surgery:  [phone]  If you experience any cold or flu symptoms such as cough, fever, chills, shortness of breath, etc. between now and your scheduled surgery, please notify us  at the above number.   Remember:  Do not eat after midnight.   You may drink clear liquids until  0430 am on 09/24/2023.    Clear liquids allowed are:                    Water, Juice (No red color; non-citric and without pulp; diabetics please choose diet or no sugar options), Carbonated beverages (diabetics please choose diet or no sugar options), Clear Tea (No creamer, milk, or cream, including half & half and powdered creamer), Black Coffee Only (No creamer, milk or cream, including half & half and powdered creamer), and Clear Sports drink (No red color; diabetics please choose diet or no sugar options)    Take these medicines the morning of surgery with A SIP OF WATER                                          esomeprazole, levothyroxine.    Do not wear jewelry, make-up or nail polish, including gel polish,  artificial nails, or any other type of covering on natural nails (fingers and  toes).  Do not wear lotions, powders, or perfumes, or deodorant.  Do not shave 48 hours prior to surgery.  Men may shave face and neck.  Do not bring valuables to the hospital.  [facility] is not responsible for any belongings or valuables.  Contacts, dentures or bridgework may not be worn into surgery.  Leave your suitcase in the car.  After surgery it may be brought to your room.  For patients admitted to the hospital, discharge time will be determined by your treatment team.  Patients discharged the day of surgery will not be allowed to drive home and must have someone with them for 24 hours.    Special instructions:    DO NOT smoke tobacco or vape for 24 hours before your procedure.  Please read over the following fact sheets that you were given. Anesthesia Post-op Instructions and Care and Recovery After Surgery         Laparoscopically Assisted Vaginal Hysterectomy, Care After After a LAVH, it's common to have soreness and numbness in your incision areas. You'll have pain in your belly. You may also have: Bleeding and discharge from the vagina. Tiredness. Sadness and other emotions. If your ovaries were taken out, you may also have symptoms of menopause, such as hot flashes, night sweats, and lack of sleep. Follow these instructions at home: Medicines Take over-the-counter and prescription medicines only as told by your health care provider. If you were given antibiotics, take them as told by your provider. Do not stop taking the antibiotic even if you start to feel better. Ask your provider if the medicine prescribed to you: Requires you to avoid driving or using machinery. Can cause constipation. You may need to take these actions to prevent or treat constipation: Drink enough fluid to keep your pee (urine) pale yellow. Take over-the-counter  or prescription medicines. Eat foods that are high in fiber, such as beans, whole grains, and fresh fruits and vegetables. Limit foods that are high in fat and processed sugars, such as fried or sweet foods. Incision care  Follow instructions from your provider about how to take care of your incisions. Make sure you: Wash your hands with soap and water for at least 20 seconds before and after you change your bandage. If soap and water aren't available, use hand sanitizer. Change your dressing as told by your provider. Leave stitches, skin glue, or tape strips in place. These skin closures may need to stay in place for 2 weeks or longer. If tape strip edges start to loosen and curl up, you may trim the loose edges. Do not remove tape strips completely unless  your provider tells you to do that. Check your incision areas every day for signs of infection. Check for: More redness, swelling, or pain. More fluid or blood. Warmth. Pus or a bad smell. Activity  Rest as told by your provider. Do not sit for a long time without moving. Get up to take short walks every 1-2 hours. This will improve blood flow and breathing. Ask for help if you feel weak or unsteady. You may have to avoid lifting. Ask your provider how much you can safely lift. Return to your normal activities as told by your provider. Ask your provider what activities are safe for you. Lifestyle Do not use any products that contain nicotine or tobacco. These products include cigarettes, chewing tobacco, and vaping devices, such as e-cigarettes. These can delay healing after surgery. If you need help quitting, ask your provider. Do not drink alcohol until your provider approves. General instructions Do not douche, use tampons, have sex, or put anything in the vagina for at least 6 weeks. If you struggle with physical or emotional changes after your procedure, speak with your provider or a therapist. Do not take baths, swim, or use a hot tub until your provider approves. Ask your provider if you may take showers. You may only be allowed to take sponge baths. Try to have someone at home with you for the first 1-2 weeks to help with your daily chores. Wear compression stockings as told by your provider. These stockings help to prevent blood clots and reduce swelling in your legs. Your provider may give you more instructions. Make sure you know what you can and can't do. Contact a health care provider if: You have any signs of infection. You have pain and your pain medicine doesn't help. You feel dizzy or light-headed. You have trouble peeing. You vomit or feel like you may vomit, and the symptoms do not go away. You have pus or discharge from your vagina that smells bad. Get help right away  if: You have a fever and your symptoms suddenly get worse. You have very bad pain in the abdomen. You have chest pain or shortness of breath. You faint. You have pain, swelling, or redness in your leg. You have heavy bleeding in your vagina that soaks through a pad in less than 1 hour. You see blood clots in your bleeding. These symptoms may be an emergency. Get help right away. Call 911. Do not wait to see if the symptoms will go away. Do not drive yourself to the hospital. This information is not intended to replace advice given to you by your health care provider. Make sure you discuss any questions you have with your health care  provider. Document Revised: 07/12/2022 Document Reviewed: 07/12/2022 Elsevier Patient Education  2024 [employer].General Anesthesia, Adult, Care After The following information offers guidance on how to care for yourself after your procedure. Your health care provider may also give you more specific instructions. If you have problems or questions, contact your health care provider. What can I expect after the procedure? After the procedure, it is common for people to: Have pain or discomfort at the IV site. Have nausea or vomiting. Have a sore throat or hoarseness. Have trouble concentrating. Feel cold or chills. Feel weak, sleepy, or tired (fatigue). Have soreness and body aches. These can affect parts of the body that were not involved in surgery. Follow these instructions at home: For the time period you were told by your health care provider:  Rest. Do not participate in activities where you could fall or become injured. Do not drive or use machinery. Do not drink alcohol. Do not take sleeping pills or medicines that cause drowsiness. Do not make important decisions or sign legal documents. Do not take care of children on your own. General instructions Drink enough fluid to keep your urine pale yellow. If you have sleep apnea, surgery and  certain medicines can increase your risk for breathing problems. Follow instructions from your health care provider about wearing your sleep device: Anytime you are sleeping, including during daytime naps. While taking prescription pain medicines, sleeping medicines, or medicines that make you drowsy. Return to your normal activities as told by your health care provider. Ask your health care provider what activities are safe for you. Take over-the-counter and prescription medicines only as told by your health care provider. Do not use any products that contain nicotine or tobacco. These products include cigarettes, chewing tobacco, and vaping devices, such as e-cigarettes. These can delay incision healing after surgery. If you need help quitting, ask your health care provider. Contact a health care provider if: You have nausea or vomiting that does not get better with medicine. You vomit every time you eat or drink. You have pain that does not get better with medicine. You cannot urinate or have bloody urine. You develop a skin rash. You have a fever. Get help right away if: You have trouble breathing. You have chest pain. You vomit blood. These symptoms may be an emergency. Get help right away. Call 911. Do not wait to see if the symptoms will go away. Do not drive yourself to the hospital. Summary After the procedure, it is common to have a sore throat, hoarseness, nausea, vomiting, or to feel weak, sleepy, or fatigue. For the time period you were told by your health care provider, do not drive or use machinery. Get help right away if you have difficulty breathing, have chest pain, or vomit blood. These symptoms may be an emergency. This information is not intended to replace advice given to you by your health care provider. Make sure you discuss any questions you have with your health care provider. Document Revised: 06/29/2021 Document Reviewed: 06/29/2021 Elsevier Patient Education   2024 Else[employer] Inc.How to Use Chlorhexidine  at Home in the Shower Chlorhexidine  gluconate (CHG) is a germ-killing (antiseptic) wash that's used to clean the skin. It can get rid of the germs that normally live on the skin and can keep them away for about 24 hours. If you're having surgery, you may be told to shower with CHG at home the night before surgery. This can help lower your risk for infection. To use CHG wash  in the shower, follow the steps below. Supplies needed: CHG body wash. Clean washcloth. Clean towel. How to use CHG in the shower Follow these steps unless you're told to use CHG in a different way: Start the shower. Use your normal soap and shampoo to wash your face and hair. Turn off the shower or move out of the shower stream. Pour CHG onto a clean washcloth. Do not use any type of brush or rough sponge. Start at your neck, washing your body down to your toes. Make sure you: Wash the part of your body where the surgery will be done for at least 1 minute. Do not scrub. Do not use CHG on your head or face unless your health care provider tells you to. If it gets into your ears or eyes, rinse them well with water. Do not wash your genitals with CHG. Wash your back and under your arms. Make sure to wash skin folds. Let the CHG sit on your skin for 1-2 minutes or as long as told. Rinse your entire body in the shower, including all body creases and folds. Turn off the shower. Dry off with a clean towel. Do not put anything on your skin afterward, such as powder, lotion, or perfume. Put on clean clothes or pajamas. If it's the night before surgery, sleep in clean sheets. General tips Use CHG only as told, and follow the instructions on the label. Use the full amount of CHG as told. This is often one bottle. Do not smoke and stay away from flames after using CHG. Your skin may feel sticky after using CHG. This is normal. The sticky feeling will go away as the CHG dries. Do not  use CHG: If you have a chlorhexidine  allergy or have reacted to chlorhexidine  in the past. On open wounds or areas of skin that have broken skin, cuts, or scrapes. On babies younger than 9 months of age. Contact a health care provider if: You have questions about using CHG. Your skin gets irritated or itchy. You have a rash after using CHG. You swallow any CHG. Call your local poison control center [phone] in the [place] Your eyes itch badly, or they become very red or swollen. Your hearing changes. You have trouble seeing. If you can't reach your provider, go to an urgent care or emergency room. Do not drive yourself. Get help right away if: You have swelling or tingling in your mouth or throat. You make high-pitched whistling sounds when you breathe, most often when you breathe out (wheeze). You have trouble breathing. These symptoms may be an emergency. Call 911 right away. Do not wait to see if the symptoms will go away. Do not drive yourself to the hospital. This information is not intended to replace advice given to you by your health care provider. Make sure you discuss any questions you have with your health care provider. Document Revised: 10/15/2022 Document Reviewed: 10/11/2021 Elsevier Patient Education  2024 [employer].

## 2023-09-22 ENCOUNTER — Encounter (HOSPITAL_COMMUNITY)
Admission: RE | Admit: 2023-09-22 | Discharge: 2023-09-22 | Disposition: A | Discharge status: Home / Self Care | Source: Ambulatory Visit | Attending: Obstetrics & Gynecology | Admitting: Obstetrics & Gynecology

## 2023-09-22 ENCOUNTER — Encounter (HOSPITAL_COMMUNITY): Payer: Self-pay

## 2023-09-22 ENCOUNTER — Other Ambulatory Visit: Payer: Self-pay

## 2023-09-22 DIAGNOSIS — Z01818 Encounter for other preprocedural examination: Secondary | ICD-10-CM

## 2023-09-22 DIAGNOSIS — N950 Postmenopausal bleeding: Secondary | ICD-10-CM | POA: Diagnosis not present

## 2023-09-22 DIAGNOSIS — Z01812 Encounter for preprocedural laboratory examination: Secondary | ICD-10-CM | POA: Insufficient documentation

## 2023-09-22 LAB — BASIC METABOLIC PANEL WITH GFR
Anion gap: 11 (ref 5–15)
BUN: 16 mg/dL (ref 6–20)
CO2: 24 mmol/L (ref 22–32)
Calcium: 9.1 mg/dL (ref 8.9–10.3)
Chloride: 103 mmol/L (ref 98–111)
Creatinine, Ser: 0.61 mg/dL (ref 0.44–1.00)
GFR, Estimated: >60 mL/min (ref >60)
Glucose, Bld: 103 mg/dL — ABNORMAL HIGH (ref 70–99)
Potassium: 3.8 mmol/L (ref 3.5–5.1)
Sodium: 138 mmol/L (ref 135–145)

## 2023-09-22 LAB — CBC
HCT: 34.6 % — ABNORMAL LOW (ref 36.0–46.0)
Hemoglobin: 11.2 g/dL — ABNORMAL LOW (ref 12.0–15.0)
MCH: 30.5 pg (ref 26.0–34.0)
MCHC: 32.4 g/dL (ref 30.0–36.0)
MCV: 94.3 fL (ref 80.0–100.0)
Platelets: 293 10*3/uL (ref 150–400)
RBC: 3.67 MIL/uL — ABNORMAL LOW (ref 3.87–5.11)
RDW: 14.5 % (ref 11.5–15.5)
WBC: 4.7 10*3/uL (ref 4.0–10.5)
nRBC: 0 % (ref 0.0–0.2)

## 2023-09-22 LAB — TYPE AND SCREEN
ABO/RH(D): O POS
Antibody Screen: NEGATIVE

## 2023-09-23 NOTE — H&P (Signed)
 [facility] Attending History and Physical  Anna West is a 58 y.o. G0P0000 who presents for scheduled robotic assisted laparoscopic hysterectomy and bilateral salpingo-oophorectomy, possible cystoscopy.  In review, pt that in Feb she had bleeding that was similar to a period.  History obtained per family member.She has not had any bleeding for many years prior to this event.  Today she notes no acute complaints or changes since her last visit.  Denies further episodes of bleeding.    Both in office EMB and hysteroscopy, D&C were attempted, but unsuccessful due to the large fibroid displacing the endometrial cavity.  Denies any abnormal vaginal discharge, fevers, chills, sweats, dysuria, nausea, vomiting, other GI or GU symptoms or other general symptoms.  Past Medical History:  Diagnosis Date   Anemia    Coronary artery disease    GERD (gastroesophageal reflux disease)    Hypertension    Mentally challenged    Thyroid  disease    Past Surgical History:  Procedure Laterality Date   HYSTEROSCOPY WITH D & C N/A 07/22/2023   Procedure: DILATATION AND CURETTAGE /HYSTEROSCOPY;  Surgeon: Jaiveon Suppes, DO;  Location: [facility];  Service: Gynecology;  Laterality: N/A;   MULTIPLE TOOTH EXTRACTIONS     OB History  Gravida Para Term Preterm AB Living  0 0 0 0 0 0  SAB IAB Ectopic Multiple Live Births  0 0 0 0 0  Patient denies any other pertinent gynecologic issues.  No current facility-administered medications on file prior to encounter.   Current Outpatient Medications on File Prior to Encounter  Medication Sig Dispense Refill   esomeprazole (NEXIUM) 20 MG capsule Take 20 mg by mouth daily.     hydrochlorothiazide (HYDRODIURIL) 25 MG tablet Take 25 mg by mouth daily.     levothyroxine (SYNTHROID) 75 MCG tablet Take 75 mcg by mouth.     pravastatin (PRAVACHOL) 80 MG tablet Take 80 mg by mouth daily.     No Known Allergies  Social History:   reports that  she has never smoked. She has never used smokeless tobacco. She reports that she does not drink alcohol and does not use drugs. Family History  Problem Relation Age of Onset   Cancer Father    Bone cancer Father    Hypertension Mother     Review of Systems: Pertinent items noted in HPI and remainder of comprehensive ROS otherwise negative.  PHYSICAL EXAM: Vitals pending CONSTITUTIONAL: Well-developed, well-nourished female in no acute distress.  SKIN: Skin is warm and dry. No rash noted. Not diaphoretic. No erythema. No pallor. NEUROLOGIC: Alert and oriented to person, place, and time. Normal reflexes, muscle tone coordination. No cranial nerve deficit noted. PSYCHIATRIC: Normal mood and affect. Normal behavior. Normal judgment and thought content. CARDIOVASCULAR: Normal heart rate noted, regular rhythm RESPIRATORY: Effort and breath sounds normal, no problems with respiration noted ABDOMEN: Soft, nontender, nondistended. PELVIC: deferred MUSCULOSKELETAL: no calf tenderness bilaterally EXT: no edema bilaterally, normal pulses  Labs: Results for orders placed or performed during the hospital encounter of 09/22/23 (from the past 2 weeks)  CBC   Collection Time: 09/22/23  9:06 AM  Result Value Ref Range   WBC 4.7 4.0 - 10.5 K/uL   RBC 3.67 (L) 3.87 - 5.11 MIL/uL   Hemoglobin 11.2 (L) 12.0 - 15.0 g/dL   HCT 30.8 (L) 65.7 - 84.6 %   MCV 94.3 80.0 - 100.0 fL   MCH 30.5 26.0 - 34.0 pg   MCHC 32.4 30.0 -  36.0 g/dL   RDW 09.8 11.9 - 14.7 %   Platelets 293 150 - 400 K/uL   nRBC 0.0 0.0 - 0.2 %  Basic metabolic panel   Collection Time: 09/22/23  9:06 AM  Result Value Ref Range   Sodium 138 135 - 145 mmol/L   Potassium 3.8 3.5 - 5.1 mmol/L   Chloride 103 98 - 111 mmol/L   CO2 24 22 - 32 mmol/L   Glucose, Bld 103 (H) 70 - 99 mg/dL   BUN 16 6 - 20 mg/dL   Creatinine, Ser 8.29 0.44 - 1.00 mg/dL   Calcium 9.1 8.9 - 56.2 mg/dL   GFR, Estimated >13 >08 mL/min   Anion gap 11 5 - 15   Type and screen [facility]   Collection Time: 09/22/23  9:06 AM  Result Value Ref Range   ABO/RH(D) O POS    Antibody Screen NEG    Sample Expiration 10/06/2023,2359    Extend sample reason      NO TRANSFUSIONS OR PREGNANCY IN THE PAST 3 MONTHS Performed at [facility], [address] [place], [place] [address]     Imaging Studies:  14.1 x 9.4 x 12.4 cm, Total uterine volume 860 ON:GEXBMWUX heterogeneous anteverted uterus VOL 860 ml,large submucosal fibroid with calcifications mid uterus distorting the endometrium to the right 12 x 8 x 9.5 cm   Thickened endometrium 8-34mm and distorted due to endometrium. Limited, but normal view of ovaries bilaterally.  Assessment: Postmenopausal bleeding Uterine fibroid   Plan: Robotic assisted laparoscopic hysterectomy and bilateral salpingo-oophorectomy, possible cystoscopy -Ancef 2g IV -Toradol  -NPO -LR @ 125cc/hr -SCDs to OR -Risk/benefits and alternatives reviewed with the patient including but not limited to risk of bleeding, infection and injury to surrounding organs requiring further surgical intervention.  Also discussed potential conversion to open surgical intervention.  Reviewed potential surgical risk ranging from cuff dehiscence to VTE complications such as PE, DVT or death.  Questions and concerns were addressed and pt desires to proceed- mom present for informed consent.  Naly Schwanz, DO Attending Obstetrician & Gynecologist, [facility] for [employer], [facility]

## 2023-09-23 NOTE — Anesthesia Preprocedure Evaluation (Signed)
 Anesthesia Evaluation  Patient identified by MRN, date of birth, ID band Patient awake    Reviewed: Allergy & Precautions, H&P , NPO status , Patient's Chart, lab work & pertinent test results, reviewed documented beta blocker date and time   Airway Mallampati: II  TM Distance: >3 FB Neck ROM: full    Dental  (+) Dental Advisory Given, Poor Dentition, Missing, Edentulous Upper,  Only 4 decayed teeth lower:   Pulmonary neg pulmonary ROS   Pulmonary exam normal breath sounds clear to auscultation       Cardiovascular Exercise Tolerance: Good hypertension, + CAD  Normal cardiovascular exam Rhythm:regular Rate:Normal     Neuro/Psych Mental impairment  negative psych ROS   GI/Hepatic negative GI ROS, Neg liver ROS,GERD  ,,  Endo/Other    Class 3 obesity  Renal/GU negative Renal ROS  negative genitourinary   Musculoskeletal   Abdominal   Peds  Hematology negative hematology ROS (+)   Anesthesia Other Findings   Reproductive/Obstetrics negative OB ROS                             Anesthesia Physical Anesthesia Plan  ASA: 3  Anesthesia Plan: General   Post-op Pain Management:    Induction: Intravenous  PONV Risk Score and Plan: Ondansetron , Dexamethasone, Midazolam  and Scopolamine patch - Pre-op  Airway Management Planned: Oral ETT  Additional Equipment: None  Intra-op Plan:   Post-operative Plan: Extubation in OR  Informed Consent: I have reviewed the patients History and Physical, chart, labs and discussed the procedure including the risks, benefits and alternatives for the proposed anesthesia with the patient or authorized representative who has indicated his/her understanding and acceptance.     Dental Advisory Given  Plan Discussed with: CRNA  Anesthesia Plan Comments:        Anesthesia Quick Evaluation

## 2023-09-24 ENCOUNTER — Encounter (HOSPITAL_COMMUNITY): Payer: Self-pay | Admitting: Obstetrics & Gynecology

## 2023-09-24 ENCOUNTER — Ambulatory Visit (HOSPITAL_COMMUNITY): Admitting: Certified Registered"

## 2023-09-24 ENCOUNTER — Encounter (HOSPITAL_COMMUNITY)
Admission: RE | Disposition: A | Discharge status: Home / Self Care | Payer: Self-pay | Source: Home / Self Care | Attending: Obstetrics & Gynecology

## 2023-09-24 ENCOUNTER — Ambulatory Visit (HOSPITAL_COMMUNITY)
Admission: RE | Admit: 2023-09-24 | Discharge: 2023-09-24 | Disposition: A | Discharge status: Home / Self Care | Attending: Obstetrics & Gynecology | Admitting: Obstetrics & Gynecology

## 2023-09-24 DIAGNOSIS — D252 Subserosal leiomyoma of uterus: Secondary | ICD-10-CM

## 2023-09-24 DIAGNOSIS — I2510 Atherosclerotic heart disease of native coronary artery without angina pectoris: Secondary | ICD-10-CM | POA: Diagnosis not present

## 2023-09-24 DIAGNOSIS — I10 Essential (primary) hypertension: Secondary | ICD-10-CM | POA: Insufficient documentation

## 2023-09-24 DIAGNOSIS — N950 Postmenopausal bleeding: Secondary | ICD-10-CM

## 2023-09-24 DIAGNOSIS — D251 Intramural leiomyoma of uterus: Secondary | ICD-10-CM

## 2023-09-24 DIAGNOSIS — K219 Gastro-esophageal reflux disease without esophagitis: Secondary | ICD-10-CM | POA: Insufficient documentation

## 2023-09-24 DIAGNOSIS — N72 Inflammatory disease of cervix uteri: Secondary | ICD-10-CM

## 2023-09-24 DIAGNOSIS — Z01818 Encounter for other preprocedural examination: Secondary | ICD-10-CM

## 2023-09-24 HISTORY — PX: ROBOTIC ASSISTED TOTAL HYSTERECTOMY WITH BILATERAL SALPINGO OOPHERECTOMY: SHX6086

## 2023-09-24 LAB — ABO/RH: ABO/RH(D): O POS

## 2023-09-24 SURGERY — HYSTERECTOMY, TOTAL, ROBOT-ASSISTED, LAPAROSCOPIC, WITH BILATERAL SALPINGO-OOPHORECTOMY
Anesthesia: General | Site: Abdomen | Laterality: Bilateral

## 2023-09-24 MED ORDER — ORAL CARE MOUTH RINSE
15.0000 mL | Freq: Once | OROMUCOSAL | Status: AC
Start: 2023-09-24 — End: 2023-09-24

## 2023-09-24 MED ORDER — FENTANYL CITRATE (PF) 100 MCG/2ML IJ SOLN
INTRAMUSCULAR | Status: AC
Start: 2023-09-24 — End: 2023-09-24
  Filled 2023-09-24: qty 2

## 2023-09-24 MED ORDER — VASOPRESSIN 20 UNIT/ML IV SOLN
INTRAVENOUS | Status: AC
  Filled 2023-09-24: qty 1

## 2023-09-24 MED ORDER — PROPOFOL 10 MG/ML IV BOLUS
INTRAVENOUS | Status: DC | PRN
Start: 2023-09-24 — End: 2023-09-24
  Administered 2023-09-24: 200 mg via INTRAVENOUS

## 2023-09-24 MED ORDER — LACTATED RINGERS IV SOLN: INTRAVENOUS | Status: DC

## 2023-09-24 MED ORDER — SODIUM CHLORIDE 0.9 % IV SOLN: 12.5000 mg | INTRAVENOUS | Status: DC | PRN

## 2023-09-24 MED ORDER — FENTANYL CITRATE (PF) 250 MCG/5ML IJ SOLN
INTRAMUSCULAR | Status: AC
  Filled 2023-09-24: qty 5

## 2023-09-24 MED ORDER — BUPIVACAINE HCL (PF) 0.25 % IJ SOLN
INTRAMUSCULAR | Status: AC
  Filled 2023-09-24: qty 60

## 2023-09-24 MED ORDER — OXYCODONE HCL 5 MG PO TABS
5.0000 mg | ORAL_TABLET | Freq: Once | ORAL | Status: AC | PRN
  Administered 2023-09-24: 5 mg via ORAL
  Filled 2023-09-24: qty 1

## 2023-09-24 MED ORDER — HYDROMORPHONE HCL 1 MG/ML IJ SOLN
0.2500 mg | INTRAMUSCULAR | Status: DC | PRN
  Administered 2023-09-24: 0.25 mg via INTRAVENOUS
  Filled 2023-09-24: qty 0.5

## 2023-09-24 MED ORDER — DEXAMETHASONE SODIUM PHOSPHATE 10 MG/ML IJ SOLN
INTRAMUSCULAR | Status: AC
  Filled 2023-09-24: qty 1

## 2023-09-24 MED ORDER — OXYCODONE HCL 5 MG PO TABS: 5.0000 mg | ORAL_TABLET | Freq: Four times a day (QID) | ORAL | 0 refills | Status: AC | PRN

## 2023-09-24 MED ORDER — TRANEXAMIC ACID-NACL 1000-0.7 MG/100ML-% IV SOLN
INTRAVENOUS | Status: AC
  Filled 2023-09-24: qty 100

## 2023-09-24 MED ORDER — LIDOCAINE 2% (20 MG/ML) 5 ML SYRINGE
INTRAMUSCULAR | Status: DC | PRN
  Administered 2023-09-24: 80 mg via INTRAVENOUS

## 2023-09-24 MED ORDER — SCOPOLAMINE 1 MG/3DAYS TD PT72
1.0000 | MEDICATED_PATCH | Freq: Once | TRANSDERMAL | Status: DC
  Administered 2023-09-24: 1.5 mg via TRANSDERMAL
  Filled 2023-09-24: qty 1

## 2023-09-24 MED ORDER — MIDAZOLAM HCL 5 MG/5ML IJ SOLN
INTRAMUSCULAR | Status: DC | PRN
  Administered 2023-09-24: 1.5 mg via INTRAVENOUS

## 2023-09-24 MED ORDER — GABAPENTIN 300 MG PO CAPS: 300.0000 mg | ORAL_CAPSULE | Freq: Three times a day (TID) | ORAL | 0 refills | Status: AC

## 2023-09-24 MED ORDER — ACETAMINOPHEN 325 MG PO TABS: 650.0000 mg | ORAL_TABLET | Freq: Four times a day (QID) | ORAL | 1 refills | Status: AC | PRN

## 2023-09-24 MED ORDER — FENTANYL CITRATE (PF) 100 MCG/2ML IJ SOLN
INTRAMUSCULAR | Status: DC | PRN
  Administered 2023-09-24 (×2): 50 ug via INTRAVENOUS
  Administered 2023-09-24: 25 ug via INTRAVENOUS
  Administered 2023-09-24 (×2): 50 ug via INTRAVENOUS
  Administered 2023-09-24: 25 ug via INTRAVENOUS

## 2023-09-24 MED ORDER — PHENYLEPHRINE 80 MCG/ML (10ML) SYRINGE FOR IV PUSH (FOR BLOOD PRESSURE SUPPORT)
PREFILLED_SYRINGE | INTRAVENOUS | Status: DC | PRN
Start: 2023-09-24 — End: 2023-09-24
  Administered 2023-09-24: 120 ug via INTRAVENOUS

## 2023-09-24 MED ORDER — KETOROLAC TROMETHAMINE 30 MG/ML IJ SOLN
30.0000 mg | INTRAMUSCULAR | Status: AC
  Administered 2023-09-24: 30 mg via INTRAVENOUS
  Filled 2023-09-24: qty 1

## 2023-09-24 MED ORDER — ROCURONIUM BROMIDE 100 MG/10ML IV SOLN
INTRAVENOUS | Status: DC | PRN
  Administered 2023-09-24: 60 mg via INTRAVENOUS
  Administered 2023-09-24: 5 mg via INTRAVENOUS
  Administered 2023-09-24 (×3): 20 mg via INTRAVENOUS

## 2023-09-24 MED ORDER — ONDANSETRON HCL 4 MG/2ML IJ SOLN
INTRAMUSCULAR | Status: DC | PRN
  Administered 2023-09-24: 4 mg via INTRAVENOUS

## 2023-09-24 MED ORDER — BUPIVACAINE HCL (PF) 0.25 % IJ SOLN
INTRAMUSCULAR | Status: DC | PRN
  Administered 2023-09-24 (×2): 10 mL

## 2023-09-24 MED ORDER — CHLORHEXIDINE GLUCONATE 0.12 % MT SOLN
15.0000 mL | Freq: Once | OROMUCOSAL | Status: AC
  Administered 2023-09-24: 15 mL via OROMUCOSAL
  Filled 2023-09-24: qty 15

## 2023-09-24 MED ORDER — CEFAZOLIN SODIUM 1 G IJ SOLR
INTRAMUSCULAR | Status: AC
  Filled 2023-09-24: qty 20

## 2023-09-24 MED ORDER — STERILE WATER FOR IRRIGATION IR SOLN
Status: DC | PRN
  Administered 2023-09-24: 1000 mL

## 2023-09-24 MED ORDER — ONDANSETRON HCL 4 MG/2ML IJ SOLN
INTRAMUSCULAR | Status: AC
Start: 2023-09-24 — End: 2023-09-24
  Filled 2023-09-24: qty 2

## 2023-09-24 MED ORDER — ACETAMINOPHEN 500 MG PO TABS
1000.0000 mg | ORAL_TABLET | Freq: Once | ORAL | Status: AC
  Administered 2023-09-24: 1000 mg via ORAL
  Filled 2023-09-24: qty 2

## 2023-09-24 MED ORDER — SODIUM CHLORIDE 0.9 % IR SOLN
Status: DC | PRN
  Administered 2023-09-24: 3000 mL

## 2023-09-24 MED ORDER — KETOROLAC TROMETHAMINE 30 MG/ML IJ SOLN
INTRAMUSCULAR | Status: AC
  Filled 2023-09-24: qty 1

## 2023-09-24 MED ORDER — IBUPROFEN 600 MG PO TABS: 600.0000 mg | ORAL_TABLET | Freq: Four times a day (QID) | ORAL | 0 refills | Status: AC | PRN

## 2023-09-24 MED ORDER — MIDAZOLAM HCL 2 MG/2ML IJ SOLN
INTRAMUSCULAR | Status: AC
Start: 2023-09-24 — End: 2023-09-24
  Filled 2023-09-24: qty 2

## 2023-09-24 MED ORDER — SCOPOLAMINE 1 MG/3DAYS TD PT72
MEDICATED_PATCH | TRANSDERMAL | Status: AC
  Filled 2023-09-24: qty 1

## 2023-09-24 MED ORDER — CEFAZOLIN SODIUM-DEXTROSE 2-3 GM-%(50ML) IV SOLR
INTRAVENOUS | Status: DC | PRN
Start: 2023-09-24 — End: 2023-09-24
  Administered 2023-09-24: 2 g via INTRAVENOUS

## 2023-09-24 MED ORDER — DEXAMETHASONE SODIUM PHOSPHATE 10 MG/ML IJ SOLN
INTRAMUSCULAR | Status: DC | PRN
  Administered 2023-09-24: 8 mg via INTRAVENOUS

## 2023-09-24 MED ORDER — LIDOCAINE 2% (20 MG/ML) 5 ML SYRINGE
INTRAMUSCULAR | Status: AC
  Filled 2023-09-24: qty 5

## 2023-09-24 MED ORDER — OXYCODONE HCL 5 MG/5ML PO SOLN: 5.0000 mg | Freq: Once | ORAL | Status: AC | PRN

## 2023-09-24 MED ORDER — CEFAZOLIN SODIUM-DEXTROSE 2-4 GM/100ML-% IV SOLN
INTRAVENOUS | Status: AC
  Filled 2023-09-24: qty 100

## 2023-09-24 MED ORDER — SUGAMMADEX SODIUM 200 MG/2ML IV SOLN
INTRAVENOUS | Status: DC | PRN
  Administered 2023-09-24: 300 mg via INTRAVENOUS

## 2023-09-24 MED ORDER — ONDANSETRON 4 MG PO TBDP: 4.0000 mg | ORAL_TABLET | Freq: Three times a day (TID) | ORAL | 0 refills | Status: AC | PRN

## 2023-09-24 MED ORDER — ROCURONIUM BROMIDE 10 MG/ML (PF) SYRINGE
PREFILLED_SYRINGE | INTRAVENOUS | Status: AC
Start: 2023-09-24 — End: 2023-09-24
  Filled 2023-09-24: qty 10

## 2023-09-24 MED ORDER — ACETAMINOPHEN 160 MG/5ML PO SOLN
960.0000 mg | Freq: Once | ORAL | Status: AC
  Filled 2023-09-24: qty 30

## 2023-09-24 MED ORDER — CEFAZOLIN SODIUM-DEXTROSE 2-4 GM/100ML-% IV SOLN
2.0000 g | INTRAVENOUS | Status: AC
  Administered 2023-09-24: 2 g via INTRAVENOUS
  Filled 2023-09-24: qty 100

## 2023-09-24 MED ORDER — POVIDONE-IODINE 10 % EX SWAB
2.0000 | Freq: Once | CUTANEOUS | Status: AC
  Administered 2023-09-24: 2 via TOPICAL

## 2023-09-24 MED ORDER — ROCURONIUM BROMIDE 10 MG/ML (PF) SYRINGE
PREFILLED_SYRINGE | INTRAVENOUS | Status: AC
  Filled 2023-09-24: qty 10

## 2023-09-24 MED ORDER — PROPOFOL 10 MG/ML IV BOLUS
INTRAVENOUS | Status: AC
Start: 2023-09-24 — End: 2023-09-24
  Filled 2023-09-24: qty 20

## 2023-09-24 SURGICAL SUPPLY — 55 items
BAG URINE DRAIN 2000ML AR STRL (UROLOGICAL SUPPLIES) ×2 IMPLANT
BLADE SURG SZ10 CARB STEEL (BLADE) ×3 IMPLANT
BLADE SURG SZ11 CARB STEEL (BLADE) ×2 IMPLANT
CATH FOLEY 3WAY 30CC 16FR (CATHETERS) ×2 IMPLANT
CAUTERY HOOK MNPLR 1.6 DVNC XI (INSTRUMENTS) ×2 IMPLANT
CHLORAPREP W/TINT 26 (MISCELLANEOUS) ×2 IMPLANT
COVER LIGHT HANDLE (MISCELLANEOUS) ×2 IMPLANT
COVER MAYO STAND XLG (MISCELLANEOUS) ×2 IMPLANT
DERMABOND ADVANCED .7 DNX12 (GAUZE/BANDAGES/DRESSINGS) ×2 IMPLANT
DRAPE ARM DVNC X/XI (DISPOSABLE) ×8 IMPLANT
DRAPE COLUMN DVNC XI (DISPOSABLE) ×2 IMPLANT
DRAPE UTILITY W/TAPE 26X15 (DRAPES) ×4 IMPLANT
DRIVER NDL MEGA SUTCUT DVNCXI (INSTRUMENTS) IMPLANT
DRIVER NDLE MEGA SUTCUT DVNCXI (INSTRUMENTS) ×2 IMPLANT
ELECTRODE REM PT RTRN 9FT ADLT (ELECTROSURGICAL) ×2 IMPLANT
FORCEPS PROGRASP DVNC XI (FORCEP) ×2 IMPLANT
GAUZE 4X4 16PLY ~~LOC~~+RFID DBL (SPONGE) ×4 IMPLANT
GLOVE BIO SURGEON STRL SZ 6.5 (GLOVE) ×7 IMPLANT
GLOVE BIOGEL PI IND STRL 7.0 (GLOVE) ×16 IMPLANT
GOWN STRL REUS W/ TWL LRG LVL3 (GOWN DISPOSABLE) ×6 IMPLANT
GOWN STRL REUS W/TWL LRG LVL3 (GOWN DISPOSABLE) ×5 IMPLANT
GOWN STRL REUS W/TWL XL LVL3 (GOWN DISPOSABLE) ×2 IMPLANT
KIT PINK PAD W/HEAD ARE REST (MISCELLANEOUS) ×2 IMPLANT
KIT PINK PAD W/HEAD ARM REST (MISCELLANEOUS) ×2 IMPLANT
KIT TURNOVER CYSTO (KITS) ×2 IMPLANT
MANIFOLD NEPTUNE II (INSTRUMENTS) ×2 IMPLANT
NDL HYPO 25X1 1.5 SAFETY (NEEDLE) ×1 IMPLANT
NDL INSUFFLATION 14GA 120MM (NEEDLE) ×1 IMPLANT
NEEDLE HYPO 25X1 1.5 SAFETY (NEEDLE) ×2 IMPLANT
NEEDLE INSUFFLATION 14GA 120MM (NEEDLE) ×2 IMPLANT
OBTURATOR OPTICALSTD 8 DVNC (TROCAR) ×2 IMPLANT
PACK PERI GYN (CUSTOM PROCEDURE TRAY) ×2 IMPLANT
POWDER SURGICEL 3.0 GRAM (HEMOSTASIS) ×2 IMPLANT
RUMI II 3.0CM BLUE KOH-EFFICIE (DISPOSABLE) ×1 IMPLANT
SEAL UNIV 5-12 XI (MISCELLANEOUS) ×7 IMPLANT
SEALER VESSEL EXT DVNC XI (MISCELLANEOUS) ×2 IMPLANT
SET BASIN LINEN APH (SET/KITS/TRAYS/PACK) ×2 IMPLANT
SET TRI-LUMEN FLTR TB AIRSEAL (TUBING) ×2 IMPLANT
SET TUBE IRRIG SUCTION NO TIP (IRRIGATION / IRRIGATOR) ×2 IMPLANT
SOLUTION ANTFG W/FOAM PAD STRL (MISCELLANEOUS) ×2 IMPLANT
SPONGE T-LAP 18X18 ~~LOC~~+RFID (SPONGE) ×2 IMPLANT
SURGILUBE 2OZ TUBE FLIPTOP (MISCELLANEOUS) ×2 IMPLANT
SUT MNCRL AB 4-0 PS2 18 (SUTURE) ×4 IMPLANT
SUT VIC AB 2-0 CT2 27 (SUTURE) ×1 IMPLANT
SUT VICRYL 0 UR6 27IN ABS (SUTURE) ×1 IMPLANT
SUTURE DVC VLC 180 0 12IN GS21 (SUTURE) ×1 IMPLANT
SYR 10ML LL (SYRINGE) ×4 IMPLANT
SYR 20ML LL LF (SYRINGE) ×2 IMPLANT
SYR 50ML LL SCALE MARK (SYRINGE) ×2 IMPLANT
SYR CONTROL 10ML LL (SYRINGE) ×4 IMPLANT
SYSTEM CONTND EXTRCTN KII BLLN (MISCELLANEOUS) ×1 IMPLANT
TIP ENDOSCOPIC SURGICEL (TIP) ×2 IMPLANT
TIP UTERINE 5.1X6CM LAV DISP (MISCELLANEOUS) ×1 IMPLANT
TROCAR PORT AIRSEAL 8X120 (TROCAR) ×2 IMPLANT
WATER STERILE IRR 500ML POUR (IV SOLUTION) ×2 IMPLANT

## 2023-09-24 NOTE — Anesthesia Procedure Notes (Signed)
 Procedure Name: Intubation Date/Time: 09/24/2023 8:47 AM  Performed by: Leeanne Puffer, CRNAPre-anesthesia Checklist: Patient identified, Patient being monitored, Timeout performed, Emergency Drugs available and Suction available Patient Re-evaluated:Patient Re-evaluated prior to induction Oxygen Delivery Method: Circle system utilized Preoxygenation: Pre-oxygenation with 100% oxygen Induction Type: IV induction Ventilation: Mask ventilation without difficulty Laryngoscope Size: Mac and 3 Grade View: Grade I Tube type: Oral Tube size: 7.0 mm Number of attempts: 1 Airway Equipment and Method: Stylet Placement Confirmation: ETT inserted through vocal cords under direct vision, positive ETCO2 and breath sounds checked- equal and bilateral Secured at: 20 cm Tube secured with: Tape Dental Injury: Teeth and Oropharynx as per pre-operative assessment

## 2023-09-24 NOTE — Discharge Instructions (Addendum)
 Post Operative Pain Med Plan:  >Take gabapentin 300 mg three times per day, as prescribed for 4 days, try to space them evenly  >Take the Toradol  every 8 hours for the first 3 days.  After the toradol  is gone switch to Ibuprofen 600mg  every 6 hours as needed.  DO NOT TAKE the Toradol  and Ibuprofen together- take one or the other.  >In between the Toradol , take Tylenol  (over the counter) every 6 to 8 hours.  If the Tylenol  does not seem strong enough.  Take the tylenol  along with the oxycodone  every 6 hours If the oxycodone  seems to strong then just take Tylenol   >Oxycodone  will cause constipation, please be sure to take a stool softener (Colace) twice daily while taking this pain medication and/or continue this medication until your bowel regimen returns to normal  If possible try to take the Toradol  or Ibuprofen with food to help avoid upsetting your stomach  >Use a heating pad as well as needed  >I have also sent a prescription for zofran  (ondansetron ) for nausea to take if needed over the first couple of days  >Be gentle with your diet the first few days, liquids and soft non spicy food, fruits are great  >Get up and move, no lifting or straining  HOME INSTRUCTIONS  Please note any unusual or excessive bleeding, pain, swelling. Mild dizziness or drowsiness are normal for about 24 hours after surgery.   Shower when comfortable  Restrictions: No driving for 24 hours or while taking pain medications.  Activity:  No heavy lifting (> 10 lbs), nothing in vagina (no tampons, douching, or intercourse) x 4 weeks; no tub baths for 4 weeks Vaginal spotting is expected but if your bleeding is heavy, period like,  please call the office   Incision: the bandaids will fall off when they are ready to; you may clean your incision with mild soap and water but do not rub or scrub the incision site.  You may experience slight bloody drainage from your incision periodically.  This is normal.  If you  experience a large amount of drainage or the incision opens, please call your physician who will likely direct you to the emergency department.  Diet:  You may return to your regular diet.  Do not eat large meals.  Eat small frequent meals throughout the day.  Continue to drink a good amount of water at least 6-8 glasses of water per day, hydration is very important for the healing process.  Pain Management: Follow the instructions as above  Always take prescription pain medication with food.  Percocet may cause constipation, you may want to take a stool softener while taking this medication.  A prescription of colace has been sent in to take twice daily if needed while taking the oxycodone .  Be sure to drink plenty of fluids and increase your fiber to help with constipation.  Alcohol -- Avoid for 24 hours and while taking pain medications.  Nausea: Take sips of ginger ale or soda  Fever -- Call physician if temperature over 101 degrees  Follow up:  If you do not already have a follow up appointment scheduled, please call the office at [phone].  If you experience fever (a temperature greater than 100.4), pain unrelieved by pain medication, shortness of breath, swelling of a single leg, or any other symptoms which are concerning to you please the office immediately.    Post Anesthesia Home Care Instructions  Activity: Get plenty of rest for the remainder  of the day. A responsible individual must stay with you for 24 hours following the procedure.  For the next 24 hours, DO NOT: -Drive a car -Advertising copywriter -Drink alcoholic beverages -Take any medication unless instructed by your physician -Make any legal decisions or sign important papers.  Meals: Start with liquid foods such as gelatin or soup. Progress to regular foods as tolerated. Avoid greasy, spicy, heavy foods. If nausea and/or vomiting occur, drink only clear liquids until the nausea and/or vomiting subsides. Call your  physician if vomiting continues.  Special Instructions/Symptoms: Your throat may feel dry or sore from the anesthesia or the breathing tube placed in your throat during surgery. If this causes discomfort, gargle with warm salt water. The discomfort should disappear within 24 hours.  If you had a scopolamine patch placed behind your ear for the management of post- operative nausea and/or vomiting:  1. The medication in the patch is effective for 72 hours, after which it should be removed.  Wrap patch in a tissue and discard in the trash. Wash hands thoroughly with soap and water. 2. You may remove the patch earlier than 72 hours if you experience unpleasant side effects which may include dry mouth, dizziness or visual disturbances. 3. Avoid touching the patch. Wash your hands with soap and water after contact with the patch.

## 2023-09-24 NOTE — Anesthesia Postprocedure Evaluation (Signed)
 Anesthesia Post Note  Patient: Anna West  Procedure(s) Performed: HYSTERECTOMY, TOTAL, ROBOT-ASSISTED, LAPAROSCOPIC, WITH BILATERAL SALPINGO-OOPHORECTOMY (Bilateral: Abdomen)  Patient location during evaluation: PACU Anesthesia Type: General Level of consciousness: awake and alert Pain management: pain level controlled Vital Signs Assessment: post-procedure vital signs reviewed and stable Respiratory status: spontaneous breathing, nonlabored ventilation, respiratory function stable and patient connected to nasal cannula oxygen Cardiovascular status: blood pressure returned to baseline and stable Postop Assessment: no apparent nausea or vomiting Anesthetic complications: no   There were no known notable events for this encounter.   Last Vitals:  Vitals:   09/24/23 1500 09/24/23 1526  BP: (!) 137/92 (!) 147/100  Pulse: 98 (!) 101  Resp: 18 16  Temp:  (!) 36.4 C  SpO2: 100%     Last Pain:  Vitals:   09/24/23 1530  TempSrc:   PainSc: 7                  Sherod Cisse L Areej Tayler

## 2023-09-24 NOTE — Op Note (Signed)
 Pre Op Dx:   Postmenopausal bleeding, uterine fibroids Post Op Dx:   Same  Procedure:   Robotic Assisted Total Laparoscopic Hysterectomy and Bilateral salpingo-oophorectomy   Surgeon:  Dr. Keene Pastures Assistants: Dr. Daisy Du Anesthesia:  general   EBL:  50cc  IVF:  2000cc UOP:  100cc   Drains:  Foley catheter Specimen removed: Uterus with fibroids, bilateral fallopian tubes and ovaries Findings: Enlarged lobulated uterus with at least 10 cm intramural fibroid.  Normal fallopian tubes and ovaries bilaterally Complications: None  Description of procedure:  After informed consent the patient was taken to the operating room and placed in dorsal supine position where general endotracheal anesthesia was administered and found to be adequate.  She was placed in dorsal lithotomy position with her arms tucked.  She was prepped and draped in the usual sterile fashion.  A timeout was called and the procedure confirmed.    A Foley catheter was placed.  A pediatric speculum was placed and visualization of the cervix was difficult.  The cervix was prepped with Betadine.  A single-tooth tenaculum was placed on the cervix and dilators were used and initially passed without difficulty.  The Rumi manipulator was placed with difficulty as the patient's introitus barely allowed for space for placement of the manipulator.  With advancement of the Rumi there was some concern that this was not completely around the entire cervix; however it was placed to the best of our ability.  Gloves were changed and attention was then turned to the abdomen.  An incision was made in the supraumbilical area and the Veress needle was inserted into the abdominal cavity without difficulty. Proper placement was confirmed using the saline drop test and opening pressure was 4 mmHg. A pneumoperitoneum was obtained. The laparoscopic trocar and the laparoscope were placed under direct visualization.  Three additional 7mm ports were  placed on either side of the umbilicus in a M-shapped pattern and an 8mm port was placed in the left upper quadrant under direct visualization.  The patient was placed in Trendelenburg position and the [employer] robotic device was docked.  Next, attention was turned to the console where the hysterectomy was performed.  The findings were noted as above with the enlarged uterus taking up the majority of her pelvic field.  Attention was first turned to the left side.  The right utero ovarian ligament and fallopian tube were ligated using the vessel sealer.  The round ligament was divided and as visualization allowed this was extended anteriorly to help create the bladder flap.  A similar seizure was carried out on the right side.   The anterior leaflet of the broad ligament was divided to create a bladder flap.  An assistant from below attempted to manipulate the Rumi however it did not appear to be in proper location.  The uterine artery and vein were skeletonized and desiccated superior to the prior dissection of the vesicouterine area.  This process was repeated on the contralateral side. After assuring that the bladder was well out of the way, the hook was used to incise the cervix at a level just above the uterosacral ligaments.  The body of the uterus was then placed in the upper abdomen.  Using the [facility] manipulator as a guide which was noted immediately medial to the right uterosacral, the hook and vessel sealer were used to resect the remaining portion of the cervix.  This small specimen was then placed within the vagina and sent to pathology.  Attention  was then turned to the right adnexa.  Using the vessel sealer the fallopian tube and ovary were resected with good visualization of the ureter.  A similar procedure was carried out on the left side and while the ureter was not as well-visualized care was taken to stay close to the adnexal pedicle.  Both adnexa were then placed in the vaginal canal and  sent to pathology.  The vaginal cuff was then closed with V-loc suture in a double layer fashion.  Irrigation was completed and hemostasis confirmed.  Surgical powder was placed on the vaginal cuff and adnexa bilaterally.  The [employer] robotic device was undocked.  Under direct visualization TAP block was completed under direct visualization using 10cc of 0.25% marcaine in each of four locations.    The umbilical incision was then extended to allow for placement of a small Alexis retractor.  At this time the gas had escape the abdomen and the trocars were all removed.  The specimen was then brought up through the umbilicus.  Using sharp dissection the uterine with fibroids was morcellated in its entirety.  The skin was closed with 4-0 Vicryl in subcuticular fashion with skin glue placed atop each port site.    The patient was returned to dorsal supine position, awakened and extubated in the OR having appeared to tolerate the procedure well.  All sponge, needle, and instrument counts were correct x 2 at the end of the case.  Pt tolerated procedure well and was taken to recovery in stable condition.   Anna Lheureux, DO Attending Obstetrician & Gynecologist, [facility] for [employer], [facility]

## 2023-09-24 NOTE — Transfer of Care (Signed)
 Immediate Anesthesia Transfer of Care Note  Patient: Anna West  Procedure(s) Performed: HYSTERECTOMY, TOTAL, ROBOT-ASSISTED, LAPAROSCOPIC, WITH BILATERAL SALPINGO-OOPHORECTOMY (Bilateral: Abdomen)  Patient Location: PACU  Anesthesia Type:General  Level of Consciousness: awake  Airway & Oxygen Therapy: Patient Spontanous Breathing  Post-op Assessment: Report given to RN  Post vital signs: Reviewed and stable  Last Vitals:  Vitals Value Taken Time  BP 131/95 09/24/23 1400  Temp    Pulse 108 09/24/23 1403  Resp 24 09/24/23 1403  SpO2 100 % 09/24/23 1403  Vitals shown include unfiled device data.  Last Pain:  Vitals:   09/24/23 0813  PainSc: 0-No pain         Complications: No notable events documented.

## 2023-09-25 ENCOUNTER — Encounter (HOSPITAL_COMMUNITY): Payer: Self-pay | Admitting: Obstetrics & Gynecology

## 2023-09-30 ENCOUNTER — Encounter: Payer: Self-pay | Admitting: Obstetrics & Gynecology

## 2023-09-30 ENCOUNTER — Ambulatory Visit: Admitting: Obstetrics & Gynecology

## 2023-09-30 VITALS — BP 109/70 | Ht 59.0 in | Wt 204.6 lb

## 2023-09-30 DIAGNOSIS — Z90710 Acquired absence of both cervix and uterus: Secondary | ICD-10-CM

## 2023-09-30 DIAGNOSIS — Z90721 Acquired absence of ovaries, unilateral: Secondary | ICD-10-CM

## 2023-09-30 NOTE — Progress Notes (Signed)
    PostOp Visit Note  Anna West is a 58 y.o. G0P0000 female who presents for a postoperative visit. She is 1 week postop following a RAH, BSO completed on 6/11   Today she notes she is doing well- pain is well controlled.  Takes an ibuprofen  every now and then Denies fever or chills.  Tolerating gen diet.  +Flatus, Regular BMs.  Overall doing well and reports no acute complaints   Review of Systems Pertinent items are noted in HPI.    Objective:  BP 109/70 (BP Location: Left Arm, Patient Position: Sitting, Cuff Size: Large)   Ht 4' 11 (1.499 m)   Wt 204 lb 9.6 oz (92.8 kg)   BMI 41.32 kg/m    Physical Examination:  GENERAL ASSESSMENT: well developed and well nourished SKIN: normal color, no lesions CHEST: normal air exchange, respiratory effort normal with no retractions HEART: regular rate and rhythm ABDOMEN: soft, non-distended, minimal tenderness INCISION: healing appropriately- C/D/I EXTREMITY: no edema, no calf tenderness bilaterally PSYCH: mood appropriate, normal affect       Assessment:    Postop visit   Plan:   -meeting milestones appropriately -discussed continued pelvic rest -f/u in 7wks for next postop visit  Gwenneth Whiteman, DO Attending Obstetrician & Gynecologist, [facility] for [employer], [facility]

## 2023-10-08 LAB — SURGICAL PATHOLOGY

## 2023-10-23 ENCOUNTER — Ambulatory Visit (HOSPITAL_COMMUNITY)

## 2023-10-23 ENCOUNTER — Encounter (HOSPITAL_COMMUNITY): Payer: Self-pay

## 2023-10-23 ENCOUNTER — Encounter (HOSPITAL_COMMUNITY)

## 2023-11-19 ENCOUNTER — Ambulatory Visit: Admitting: Obstetrics & Gynecology

## 2023-11-19 ENCOUNTER — Encounter: Payer: Self-pay | Admitting: Obstetrics & Gynecology

## 2023-11-19 VITALS — BP 131/92 | Ht <= 58 in | Wt 203.6 lb

## 2023-11-19 DIAGNOSIS — Z90710 Acquired absence of both cervix and uterus: Secondary | ICD-10-CM

## 2023-11-19 DIAGNOSIS — Z90721 Acquired absence of ovaries, unilateral: Secondary | ICD-10-CM

## 2023-11-19 DIAGNOSIS — Z4889 Encounter for other specified surgical aftercare: Secondary | ICD-10-CM

## 2023-11-19 NOTE — Progress Notes (Signed)
    PostOp Visit Note  Anna West is a 58 y.o. G0P0000 female who presents for a postoperative visit. She is 8 weeks postop following a RAH, BSO completed on 6/11   Today she notes she is doing well.  She is no longer taking any pain medicine.  She denies vaginal bleeding, discharge or acute concerns Denies fever or chills.  Tolerating gen diet.  +Flatus, Regular BMs.  Overall doing well and reports no acute complaints   Review of Systems Pertinent items are noted in HPI.    Objective:  BP (!) 131/92 (BP Location: Left Arm, Patient Position: Sitting, Cuff Size: Large)   Ht 4' 9.87 (1.47 m)   Wt 203 lb 9.6 oz (92.4 kg)   BMI 42.74 kg/m    Physical Examination:  GENERAL ASSESSMENT: well developed and well nourished SKIN: normal color, no lesions CHEST: normal air exchange, respiratory effort normal with no retractions HEART: regular rate and rhythm ABDOMEN: soft, obese, non-tender, no rebound no guarding INCISION: Well-healed, clean dry and intact  GU: Normal external genitalia, pink vaginal mucosa.  Vaginal cuff appears intact though difficult to assess due to patient discomfort.  On bimanual exam vaginal cuff intact.  No abnormalities noted EXTREMITY: No edema, no calf tenderness bilaterally PSYCH: mood appropriate, normal affect       Assessment:   Postop check  Plan:   -Meeting milestones appropriately - May return to regular activity - Pap no longer indicated as uterus and cervix have been removed - Patient to follow-up as needed  Gini Caputo, DO Attending Obstetrician & Gynecologist, [facility] [employer], [facility]

## 2023-12-02 LAB — COLOGUARD

## 2024-04-13 ENCOUNTER — Ambulatory Visit (HOSPITAL_COMMUNITY): Attending: Adult Health

## 2024-04-13 ENCOUNTER — Encounter (HOSPITAL_COMMUNITY)

## 2024-04-13 ENCOUNTER — Ambulatory Visit (HOSPITAL_COMMUNITY)
